# Patient Record
Sex: Male | Born: 1950 | Race: White | Hispanic: No | Marital: Married | Smoking: Never smoker
Health system: Southern US, Community
[De-identification: ages and names within clinical notes are randomized; demographics above are authoritative.]

## PROBLEM LIST (undated history)

## (undated) DIAGNOSIS — C50919 Malignant neoplasm of unspecified site of unspecified female breast: Secondary | ICD-10-CM

## (undated) DIAGNOSIS — C50929 Malignant neoplasm of unspecified site of unspecified male breast: Secondary | ICD-10-CM

## (undated) HISTORY — PX: COLONOSCOPY: SHX174

## (undated) HISTORY — DX: Malignant neoplasm of unspecified site of unspecified female breast: C50.919

## (undated) HISTORY — DX: Malignant neoplasm of unspecified site of unspecified male breast: C50.929

---

## 1989-10-29 HISTORY — PX: OTHER SURGICAL HISTORY: SHX169

## 2009-03-22 ENCOUNTER — Ambulatory Visit (HOSPITAL_BASED_OUTPATIENT_CLINIC_OR_DEPARTMENT_OTHER): Admission: RE | Admit: 2009-03-22 | Discharge: 2009-03-22 | Payer: Self-pay | Admitting: Orthopedic Surgery

## 2011-02-06 LAB — BASIC METABOLIC PANEL
CO2: 31 mEq/L (ref 19–32)
Calcium: 9.5 mg/dL (ref 8.4–10.5)
Chloride: 93 mEq/L — ABNORMAL LOW (ref 96–112)
GFR calc Af Amer: >60 mL/min (ref >60)
Potassium: 3.9 mEq/L (ref 3.5–5.1)
Sodium: 134 mEq/L — ABNORMAL LOW (ref 135–145)

## 2011-03-13 NOTE — Op Note (Signed)
NAMESANDFORD, DIOP                ACCOUNT NO.:  [id number]   MEDICAL RECORD NO.:  [id number]          PATIENT TYPE:  AMB   LOCATION:  DSC                          FACILITY:  [facility]   PHYSICIAN:  Katy Fitch. Sypher, M.D. DATE OF BIRTH:  [birth year]   DATE OF PROCEDURE:  03/22/2009  DATE OF DISCHARGE:                               OPERATIVE REPORT   PREOPERATIVE DIAGNOSES:  Chronic left wrist radial-sided pain with  positive Finkelstein maneuver and failure to respond to steroid  injection.   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   OPERATION:  Exploration of left first dorsal compartment, debridement of  steroid deposit, and release of compartment, relieving compression of  extensor pollicis brevis.   OPERATING SURGEON:  Katy Fitch. Sypher, MD   ASSISTANT:  Marveen Reeks Dasnoit, PA-C   ANESTHESIA:  2% lidocaine, field block, and first dorsal compartment  block supplemented by IV sedation.   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  He presented for evaluation of a painful left distal  radial forearm on February 09, 2009.   At that time, he was noted to have pain with wrist motion.  He was  unable to play racquetball or golf.   Our clinical diagnosis at the time of his initial presentation was a  painful first dorsal compartment stenosing tenosynovitis.  He had  significant swelling proximal to the compartment.   We advised a trial of steroid injection and splinting.   On February 09, 2009, he was injected with Depo-Medrol and lidocaine from a  proximal approach into the first dorsal compartment.  He was placed in  proper forearm-based thumb spica splint.  After nearly 4 weeks of rest,  he  still had persistent pain and a positive Finkelstein maneuver.   Due to his persistent discomfort and inability to play sports, he  requested that we proceed with surgical treatment of his first dorsal  compartment stenosing tenosynovitis.   After informed consent, he is brought to the operating room at this  time.   PROCEDURE:  Gryphon Vanderveen was brought to the operating room and placed in  supine position upon the operating table.  Following a preoperative  discussion with Dr. Gypsy Balsam, lidocaine anesthesia and sedation was  suggested and accepted by Mr. Oros.   He was brought to room 6 of the [facility], placed in supine  position upon the operating table, and under Dr. Burnett Corrente supervision,  sedation provided.   The left arm was prepped with Betadine soap and solution and sterilely  draped.  A pneumatic tourniquet was applied to the proximal brachium.  Lidocaine 2% was infiltrated in the path intended incision into the  first dorsal compartment.  After a few moments, anesthesia was  excellent.  The arm was then exsanguinated with an Esmarch bandage and  arterial tourniquet was inflated to 230 mmHg.  A short transverse  incision was fashioned directly over the palpably thickened compartment.  Subcutaneous tissues were carefully divided taking care to gently clear  inflammatory tissue off the compartment with a Therapist, nutritional followed  by placement of blunt Ragnell retractors.  The compartment was noted to  be bulging.  There was an anatomic variation with the extensor pollicis  brevis muscle belly extending into the dorsal aspect of the compartment.   The compartment was released and tendon inventory accomplished.  There  were 2 large slips of the abductor pollicis longus and a single slip of  the extensor pollicis brevis.  The muscle belly did transit the  compartment and may be the cause of his Finkelstein maneuver pain and  chronic sports impairment.  There was no sign  of a significant  inflammatory disorder.   The proximal forearm was inspected.  No other masses or predicaments  were noted.  The steroid deposit was noted to be in the floor of the  compartment and was debrided with a rongeur.  The wound was then  repaired with intradermal 3-0 Prolene and Steri-Strips.  A compressive  dressing was applied with sterile gauze and Ace wrap.  For aftercare,  Mr. Winkles was encouraged to begin immediate range of motion excises.  I  will see him back in follow up in the office in approximately 1 week for  wound check and suture removal at approximately 7-10 days.      Katy Fitch Sypher, M.D.  Electronically Signed     RVS/MEDQ  D:  03/22/2009  T:  03/22/2009  Job:  [id number]

## 2011-12-26 ENCOUNTER — Encounter: Payer: Self-pay | Admitting: Gastroenterology

## 2011-12-31 ENCOUNTER — Encounter: Payer: Self-pay | Admitting: Gastroenterology

## 2012-01-09 ENCOUNTER — Ambulatory Visit (AMBULATORY_SURGERY_CENTER): Payer: 59 | Admitting: *Deleted

## 2012-01-09 ENCOUNTER — Encounter: Payer: Self-pay | Admitting: Gastroenterology

## 2012-01-09 VITALS — Ht 70.0 in | Wt 168.4 lb

## 2012-01-09 DIAGNOSIS — Z1211 Encounter for screening for malignant neoplasm of colon: Secondary | ICD-10-CM

## 2012-01-09 MED ORDER — PEG-KCL-NACL-NASULF-NA ASC-C 100 G PO SOLR: 1.0000 | Freq: Once | ORAL | Status: DC

## 2012-01-23 ENCOUNTER — Encounter: Payer: Self-pay | Admitting: Gastroenterology

## 2012-01-30 ENCOUNTER — Encounter: Payer: Self-pay | Admitting: Gastroenterology

## 2012-03-05 ENCOUNTER — Encounter: Payer: Self-pay | Admitting: Gastroenterology

## 2012-03-05 ENCOUNTER — Ambulatory Visit (AMBULATORY_SURGERY_CENTER): Payer: 59 | Admitting: Gastroenterology

## 2012-03-05 VITALS — BP 127/78 | HR 68 | Temp 96.4°F | Resp 62 | Ht 70.0 in | Wt 168.0 lb

## 2012-03-05 DIAGNOSIS — Z1211 Encounter for screening for malignant neoplasm of colon: Secondary | ICD-10-CM

## 2012-03-05 MED ORDER — SODIUM CHLORIDE 0.9 % IV SOLN: 500.0000 mL | INTRAVENOUS | Status: DC

## 2012-03-05 NOTE — Op Note (Signed)
[facility] [address] [place], [place]  [address]  COLONOSCOPY PROCEDURE REPORT  PATIENT:  Riley Murray, Riley Murray  MR#:  [id number] BIRTHDATE:  Sep 18, 1951, 60 yrs. old  GENDER:  male ENDOSCOPIST:  Vania Rea. Jarold Motto, MD, [facility] REF. BY: PROCEDURE DATE:  03/05/2012 PROCEDURE:  Average-risk screening colonoscopy G0121 ASA CLASS:  Class I INDICATIONS:  Routine Risk Screening MEDICATIONS:   propofol (Diprivan) 170 mg IV  DESCRIPTION OF PROCEDURE:   After the risks and benefits and of the procedure were explained, informed consent was obtained. Digital rectal exam was performed and revealed no abnormalities. The LB CF-H180AL [id number] endoscope was introduced through the anus and advanced to the cecum, which was identified by both the appendix and ileocecal valve.  The quality of the prep was adequate, using MoviPrep.  The instrument was then slowly withdrawn as the colon was fully examined. <<PROCEDUREIMAGES>>  FINDINGS:  No polyps or cancers were seen.  This was otherwise a normal examination of the colon.   Retroflexed views in the rectum revealed no abnormalities.    The scope was then withdrawn from the patient and the procedure completed.  COMPLICATIONS:  None ENDOSCOPIC IMPRESSION: 1) No polyps or cancers 2) Otherwise normal examination RECOMMENDATIONS: 1) Continue current colorectal screening recommendations for "routine risk" patients with a repeat colonoscopy in 10 years.  REPEAT EXAM:  No  ______________________________ Vania Rea. Jarold Motto, MD, [facility]  CC:  Creola Corn, MD  n. Rosalie Doctor:   Vania Rea. Acasia Skilton at 03/05/2012 10:09 AM  [place], [id number]

## 2012-03-05 NOTE — Patient Instructions (Signed)
YOU HAD AN ENDOSCOPIC PROCEDURE TODAY AT THE [facility]: Refer to the procedure report that was given to you for any specific questions about what was found during the examination.  If the procedure report does not answer your questions, please call your gastroenterologist to clarify.  If you requested that your care partner not be given the details of your procedure findings, then the procedure report has been included in a sealed envelope for you to review at your convenience later.  YOU SHOULD EXPECT: Some feelings of bloating in the abdomen. Passage of more gas than usual.  Walking can help get rid of the air that was put into your GI tract during the procedure and reduce the bloating. If you had a lower endoscopy (such as a colonoscopy or flexible sigmoidoscopy) you may notice spotting of blood in your stool or on the toilet paper. If you underwent a bowel prep for your procedure, then you may not have a normal bowel movement for a few days.  DIET: Your first meal following the procedure should be a light meal and then it is ok to progress to your normal diet.  A half-sandwich or bowl of soup is an example of a good first meal.  Heavy or fried foods are harder to digest and may make you feel nauseous or bloated.  Likewise meals heavy in dairy and vegetables can cause extra gas to form and this can also increase the bloating.  Drink plenty of fluids but you should avoid alcoholic beverages for 24 hours.  ACTIVITY: Your care partner should take you home directly after the procedure.  You should plan to take it easy, moving slowly for the rest of the day.  You can resume normal activity the day after the procedure however you should NOT DRIVE or use heavy machinery for 24 hours (because of the sedation medicines used during the test).    SYMPTOMS TO REPORT IMMEDIATELY: A gastroenterologist can be reached at any hour.  During normal business hours, 8:30 AM to 5:00 PM Monday through Friday,  call [phone].  After hours and on weekends, please call the GI answering service at [phone] who will take a message and have the physician on call contact you.   Following lower endoscopy (colonoscopy or flexible sigmoidoscopy):  Excessive amounts of blood in the stool  Significant tenderness or worsening of abdominal pains  Swelling of the abdomen that is new, acute  Fever of 100F or higher  Following upper endoscopy (EGD)  Vomiting of blood or coffee ground material  New chest pain or pain under the shoulder blades  Painful or persistently difficult swallowing  New shortness of breath  Fever of 100F or higher  Black, tarry-looking stools  FOLLOW UP: If any biopsies were taken you will be contacted by phone or by letter within the next 1-3 weeks.  Call your gastroenterologist if you have not heard about the biopsies in 3 weeks.  Our staff will call the home number listed on your records the next business day following your procedure to check on you and address any questions or concerns that you may have at that time regarding the information given to you following your procedure. This is a courtesy call and so if there is no answer at the home number and we have not heard from you through the emergency physician on call, we will assume that you have returned to your regular daily activities without incident.  SIGNATURES/CONFIDENTIALITY: You and/or your care   partner have signed paperwork which will be entered into your electronic medical record.  These signatures attest to the fact that that the information above on your After Visit Summary has been reviewed and is understood.  Full responsibility of the confidentiality of this discharge information lies with you and/or your care-partner.   REPEAT COLONOSCOPY IN 10 YEARS 

## 2012-03-05 NOTE — Progress Notes (Signed)
Patient did not have preoperative order for IV antibiotic SSI prophylaxis. (G8918)  Patient did not experience any of the following events: a burn prior to discharge; a fall within the facility; wrong site/side/patient/procedure/implant event; or a hospital transfer or hospital admission upon discharge from the facility. (G8907)  

## 2012-03-06 ENCOUNTER — Telehealth: Payer: Self-pay | Admitting: *Deleted

## 2012-03-06 NOTE — Telephone Encounter (Signed)
  Follow up Call-  Call back number 03/05/2012  Post procedure Call Back phone  # [phone] cell  Permission to leave phone message Yes     Patient questions:  Do you have a fever, pain , or abdominal swelling? no Pain Score  0 *  Have you tolerated food without any problems? yes  Have you been able to return to your normal activities? yes  Do you have any questions about your discharge instructions: Diet   no Medications  no Follow up visit  no  Do you have questions or concerns about your Care? no  Actions: * If pain score is 4 or above: No action needed, pain <4.

## 2015-02-21 ENCOUNTER — Other Ambulatory Visit: Payer: Self-pay | Admitting: Internal Medicine

## 2015-02-21 DIAGNOSIS — N63 Unspecified lump in unspecified breast: Secondary | ICD-10-CM

## 2015-02-23 ENCOUNTER — Other Ambulatory Visit: Payer: Self-pay | Admitting: Internal Medicine

## 2015-02-23 ENCOUNTER — Ambulatory Visit
Admission: RE | Admit: 2015-02-23 | Discharge: 2015-02-23 | Disposition: A | Discharge status: Home / Self Care | Payer: BLUE CROSS/BLUE SHIELD | Source: Ambulatory Visit | Attending: Internal Medicine | Admitting: Internal Medicine

## 2015-02-23 DIAGNOSIS — N63 Unspecified lump in unspecified breast: Secondary | ICD-10-CM

## 2015-02-24 ENCOUNTER — Other Ambulatory Visit: Payer: Self-pay | Admitting: Radiology

## 2015-02-24 ENCOUNTER — Other Ambulatory Visit: Payer: Self-pay | Admitting: Internal Medicine

## 2015-02-24 DIAGNOSIS — N63 Unspecified lump in unspecified breast: Secondary | ICD-10-CM

## 2015-02-24 DIAGNOSIS — C50919 Malignant neoplasm of unspecified site of unspecified female breast: Secondary | ICD-10-CM

## 2015-02-24 HISTORY — DX: Malignant neoplasm of unspecified site of unspecified female breast: C50.919

## 2015-03-01 ENCOUNTER — Telehealth: Payer: Self-pay | Admitting: *Deleted

## 2015-03-01 NOTE — Telephone Encounter (Signed)
Received referral from [place].  Received appt from Dr. Jana Hakim.  Called pt and confirmed 03/02/15 genetic appt & 03/04/15 med onc appt w/ pt.  Emailed pt appt info as requested.  Emailed Engineer, civil (consulting) at [employer] to make her aware.  Placed a copy of records in Dr [employer] box and took one to HIM to scan.

## 2015-03-02 ENCOUNTER — Ambulatory Visit (HOSPITAL_BASED_OUTPATIENT_CLINIC_OR_DEPARTMENT_OTHER): Payer: BLUE CROSS/BLUE SHIELD | Admitting: Genetic Counselor

## 2015-03-02 ENCOUNTER — Other Ambulatory Visit: Payer: BLUE CROSS/BLUE SHIELD

## 2015-03-02 ENCOUNTER — Other Ambulatory Visit: Payer: Self-pay | Admitting: Surgery

## 2015-03-02 ENCOUNTER — Encounter (HOSPITAL_BASED_OUTPATIENT_CLINIC_OR_DEPARTMENT_OTHER): Payer: Self-pay | Admitting: *Deleted

## 2015-03-02 ENCOUNTER — Encounter: Payer: Self-pay | Admitting: Genetic Counselor

## 2015-03-02 DIAGNOSIS — C50922 Malignant neoplasm of unspecified site of left male breast: Secondary | ICD-10-CM | POA: Diagnosis not present

## 2015-03-02 DIAGNOSIS — Z800 Family history of malignant neoplasm of digestive organs: Secondary | ICD-10-CM | POA: Diagnosis not present

## 2015-03-02 DIAGNOSIS — C50919 Malignant neoplasm of unspecified site of unspecified female breast: Secondary | ICD-10-CM | POA: Insufficient documentation

## 2015-03-02 DIAGNOSIS — Z315 Encounter for genetic counseling: Secondary | ICD-10-CM

## 2015-03-02 DIAGNOSIS — C50212 Malignant neoplasm of upper-inner quadrant of left female breast: Secondary | ICD-10-CM | POA: Insufficient documentation

## 2015-03-02 DIAGNOSIS — C50912 Malignant neoplasm of unspecified site of left female breast: Secondary | ICD-10-CM

## 2015-03-02 DIAGNOSIS — Z170 Estrogen receptor positive status [ER+]: Secondary | ICD-10-CM | POA: Insufficient documentation

## 2015-03-02 MED ORDER — DEXTROSE 5 % IV SOLN: 2.0000 g | INTRAVENOUS | Status: AC

## 2015-03-02 MED ORDER — CHLORHEXIDINE GLUCONATE 4 % EX LIQD: 1.0000 "application " | Freq: Once | CUTANEOUS | Status: DC

## 2015-03-02 NOTE — Progress Notes (Signed)
No labs per anesthesia needed-waiting on [facility] orders-going to cancer center 03/04/15-probably getting labs then

## 2015-03-02 NOTE — Progress Notes (Signed)
REFERRING PROVIDER: Shon Baton, MD [place], [place] [address]   Dr. Kathaleen Maser Magrinat, MD  PRIMARY PROVIDER:  Precious Reel, MD  PRIMARY REASON FOR VISIT:  1. Male breast cancer, left      HISTORY OF PRESENT ILLNESS:   Riley Murray, a 64 y.o. male, was seen for a [place] cancer genetics consultation at the request of Dr. Lucia Gaskins due to a personal history of cancer.  Riley Murray presents to clinic today to discuss the possibility of a hereditary predisposition to cancer, genetic testing, and to further clarify his future cancer risks, as well as potential cancer risks for family members.   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  The tumor has not had completed prognostic factors performed. This will be treated with surgery and possibly tamoxifen and radiation, but he does not think he will need chemotherapy.    CANCER HISTORY:   No history exists.     HORMONAL RISK FACTORS:  Colonoscopy: yes; normal. Mammogram within the last year: yes. Number of breast biopsies: 1. Up to date with prostate exams:  yes. Any excessive radiation exposure in the past:  no  Past Medical History  Diagnosis Date  . Breast cancer   . Male breast cancer     Past Surgical History  Procedure Laterality Date  . Lumbar disc removal  1991    History   Social History  . Marital Status: Married    Spouse Name: [name]  . Number of Children: 2  . Years of Education: N/A   Social History Main Topics  . Smoking status: Never Smoker   . Smokeless tobacco: Never Used  . Alcohol Use: No  . Drug Use: No  . Sexual Activity: Not on file   Other Topics Concern  . None   Social History Narrative     FAMILY HISTORY:  We obtained a detailed, 4-generation family history.  Significant diagnoses are listed below: Family History  Problem Relation Age of Onset  . Esophageal cancer Neg Hx   . Rectal cancer Neg Hx   .  Stomach cancer Neg Hx   . Stroke Mother   . Diabetes Mother   . Lung cancer Father 7   The patient has two daughters, a brother and sister who are all healthy and cancer free.  His parents are deceased.  His father was a smoker and died of lung cancer at 32.  His mother died of complications of a stroke.  His fater was an only child who's parents died of heart attacks.  Riley Murray' mother had three sisters, none of whom had cancer, and his parents were cancer free. Patient's maternal ancestors are of [place] and [place] European descent, and paternal ancestors are of English descent. There is no reported Ashkenazi Jewish ancestry. There is no known consanguinity.  GENETIC COUNSELING ASSESSMENT: Riley Murray is a 64 y.o. male with a personal history of breast cancer which somewhat suggestive of a hereditary cancer syndrome and predisposition to cancer. We, therefore, discussed and recommended the following at today's visit.   DISCUSSION: We reviewed the characteristics, features and inheritance patterns of hereditary cancer syndromes. We discussed that aobut 2/3 of male breast cancer are the result of hereditary cancer syndromes, most commonly due to BRCA2 mutations.  However, other genes, including BRCA1, PALB2, CHEK2 and ATM have also been implicated in male breast cancer.  We also discussed genetic testing, including  the appropriate family members to test, the process of testing, insurance coverage and turn-around-time for results. We discussed the implications of a negative, positive and/or variant of uncertain significant result. If he should test positive for a hereditary cancer syndrome then his siblings and daughters would be at 50% risk for also having the same gene mutation.  At that time we could offer genetic testing to these individuals to determine if they would be at risk for cancer based on a hereditary cancer syndrome.  We recommended Riley Murray pursue genetic testing for the Breast  High/Moderate risk gene panel. The Breast High/Moderate Risk gene panel offered by [facility] includes sequencing and deletion/duplication analysis of the following 9 genes: ATM, BRCA1, BRCA2, CDH1, CHEK2, PALB2, PTEN, STK11, and TP53.   PLAN: After considering the risks, benefits, and limitations, Riley Murray  provided informed consent to pursue genetic testing and the blood sample was sent to [facility] for analysis of the Breast High/Moderate Risk gene panel. Surgery is going to be scheduled for about 2 weeks from now, as the family is moving to [place].  Therefore, I have asked that the results be placed on STAT.  Results should be available within approximately 2 weeks' time, at which point they will be disclosed by telephone to Riley Murray, as will any additional recommendations warranted by these results. Riley Murray will receive a summary of his genetic counseling visit and a copy of his results once available. This information will also be available in Epic. We encouraged Riley Murray to remain in contact with cancer genetics annually so that we can continuously update the family history and inform him of any changes in cancer genetics and testing that may be of benefit for his family. Riley Murray questions were answered to his satisfaction today. Our contact information was provided should additional questions or concerns arise.  Lastly, we encouraged Riley Murray to remain in contact with cancer genetics annually so that we can continuously update the family history and inform him of any changes in cancer genetics and testing that may be of benefit for this family.   [name] questions were answered to his satisfaction today. Our contact information was provided should additional questions or concerns arise. Thank you for the referral and allowing [place] to share in the care of your patient.   Karen P. Florene Glen, [place], [facility] Certified Genetic Counselor [email or web address] phone: [phone]  The  patient was seen for a total of 60 minutes in face-to-face genetic counseling.  This patient was discussed with Drs. Magrinat, Lindi Adie and/or Burr Medico who agrees with the above.    _______________________________________________________________________ For Office Staff:  Number of people involved in session: 2 Was an Intern/ student involved with case: no

## 2015-03-03 ENCOUNTER — Other Ambulatory Visit: Payer: Self-pay | Admitting: *Deleted

## 2015-03-03 ENCOUNTER — Other Ambulatory Visit: Payer: Self-pay | Admitting: Oncology

## 2015-03-03 ENCOUNTER — Inpatient Hospital Stay: Admission: RE | Admit: 2015-03-03 | Payer: BLUE CROSS/BLUE SHIELD | Source: Ambulatory Visit

## 2015-03-03 DIAGNOSIS — C50919 Malignant neoplasm of unspecified site of unspecified female breast: Secondary | ICD-10-CM

## 2015-03-04 ENCOUNTER — Encounter: Payer: Self-pay | Admitting: Oncology

## 2015-03-04 ENCOUNTER — Other Ambulatory Visit (HOSPITAL_BASED_OUTPATIENT_CLINIC_OR_DEPARTMENT_OTHER): Payer: BLUE CROSS/BLUE SHIELD

## 2015-03-04 ENCOUNTER — Telehealth: Payer: Self-pay | Admitting: Oncology

## 2015-03-04 ENCOUNTER — Ambulatory Visit (HOSPITAL_BASED_OUTPATIENT_CLINIC_OR_DEPARTMENT_OTHER): Payer: BLUE CROSS/BLUE SHIELD | Admitting: Oncology

## 2015-03-04 ENCOUNTER — Ambulatory Visit: Payer: BLUE CROSS/BLUE SHIELD

## 2015-03-04 VITALS — BP 121/71 | HR 73 | Temp 97.8°F | Resp 18 | Wt 169.6 lb

## 2015-03-04 DIAGNOSIS — C50919 Malignant neoplasm of unspecified site of unspecified female breast: Secondary | ICD-10-CM

## 2015-03-04 DIAGNOSIS — C50222 Malignant neoplasm of upper-inner quadrant of left male breast: Secondary | ICD-10-CM

## 2015-03-04 DIAGNOSIS — Z170 Estrogen receptor positive status [ER+]: Secondary | ICD-10-CM

## 2015-03-04 DIAGNOSIS — E871 Hypo-osmolality and hyponatremia: Secondary | ICD-10-CM

## 2015-03-04 LAB — CBC WITH DIFFERENTIAL/PLATELET
BASO%: 0.7 % (ref 0.0–2.0)
BASOS ABS: 0 10*3/uL (ref 0.0–0.1)
EOS%: 1.1 % (ref 0.0–7.0)
Eosinophils Absolute: 0.1 10*3/uL (ref 0.0–0.5)
HEMATOCRIT: 41 % (ref 38.4–49.9)
HEMOGLOBIN: 13.7 g/dL (ref 13.0–17.1)
LYMPH%: 19.8 % (ref 14.0–49.0)
MCH: 32 pg (ref 27.2–33.4)
MCHC: 33.4 g/dL (ref 32.0–36.0)
MCV: 95.9 fL (ref 79.3–98.0)
MONO#: 0.5 10*3/uL (ref 0.1–0.9)
MONO%: 8.3 % (ref 0.0–14.0)
NEUT#: 4.1 10*3/uL (ref 1.5–6.5)
NEUT%: 70.1 % (ref 39.0–75.0)
Platelets: 218 10*3/uL (ref 140–400)
RBC: 4.28 10*6/uL (ref 4.20–5.82)
RDW: 12.7 % (ref 11.0–14.6)
WBC: 5.9 10*3/uL (ref 4.0–10.3)
lymph#: 1.2 10*3/uL (ref 0.9–3.3)

## 2015-03-04 LAB — COMPREHENSIVE METABOLIC PANEL (CC13)
ALBUMIN: 4 g/dL (ref 3.5–5.0)
ALT: 18 U/L (ref 0–55)
ANION GAP: 13 meq/L — AB (ref 3–11)
AST: 21 U/L (ref 5–34)
Alkaline Phosphatase: 60 U/L (ref 40–150)
BUN: 11.7 mg/dL (ref 7.0–26.0)
CALCIUM: 9.1 mg/dL (ref 8.4–10.4)
CO2: 22 meq/L (ref 22–29)
CREATININE: 0.9 mg/dL (ref 0.7–1.3)
Chloride: 97 mEq/L — ABNORMAL LOW (ref 98–109)
EGFR: >90 mL/min/{1.73_m2} (ref >90)
Glucose: 69 mg/dl — ABNORMAL LOW (ref 70–140)
POTASSIUM: 4.5 meq/L (ref 3.5–5.1)
SODIUM: 132 meq/L — AB (ref 136–145)
TOTAL PROTEIN: 7 g/dL (ref 6.4–8.3)
Total Bilirubin: 0.7 mg/dL (ref 0.20–1.20)

## 2015-03-04 NOTE — Progress Notes (Signed)
Checked in new pt with no financial concerns. °

## 2015-03-04 NOTE — Telephone Encounter (Signed)
Patient was aware already of follow up appointment and left,appointment made

## 2015-03-05 DIAGNOSIS — E871 Hypo-osmolality and hyponatremia: Secondary | ICD-10-CM | POA: Insufficient documentation

## 2015-03-05 NOTE — Progress Notes (Signed)
[place]  Telephone:([phone]) [phone] Fax:(336) [phone]     ID: Ahmir Bracken DOB: [birth year]  MR#: [id number]  ONG#:[id number]  Patient Care Team: Shon Baton, MD as PCP - General (Internal Medicine) PCP: Precious Reel, MD SU: Alphonsa Overall MD OTHER MD:  CHIEF COMPLAINT: Left-sided breast cancer  CURRENT TREATMENT: Awaiting definitive therapy   BREAST CANCER HISTORY: Richardson Landry was taking a shower mid April 2016 when he noted a lump in his left breast. He brought it to his wife's attention (she is a former patient of mine with her own history of breast cancer), and then to his physician who set him up for bilateral diagnostic mammography with tomosynthesis and left breast ultrason[facility] April 20 05/18/2015. Mammography showed a breast density category A. In the upper inner quadrant of the left breast there was an irregular mass measuring 1.1 cm. This was palpable and mobile. By ultrasound there was an irregularly marginated mass at this location in the left breast measuring 1.1 cm. The left axilla appeared normal sonographically.  Biopsy of the left breast mass in question 02/24/2015 showed (SAA 16-07/02/2007) an invasive ductal carcinoma, grade 1, with a prognostic panel still pending.  The patient's subsequent history is as detailed below.  INTERVAL HISTORY: Richardson Landry was evaluated in the breast clinic 03/04/2015 accompanied by his wife [name].  REVIEW OF SYSTEMS: Aside from the mass itself, there were no specific symptoms leading to the original mammogram, which was routinely scheduled. The patient denies unusual headaches, visual changes, nausea, vomiting, stiff neck, dizziness, or gait imbalance. There has been no cough, phlegm production, or pleurisy, no chest pain or pressure, and no change in bowel or bladder habits. The patient denies fever, rash, bleeding, unexplained fatigue or unexplained weight loss. He exercises 4 days a week at the gym. A detailed  review of systems was otherwise entirely negative.  PAST MEDICAL HISTORY: Past Medical History  Diagnosis Date  . Breast cancer   . Male breast cancer     PAST SURGICAL HISTORY: Past Surgical History  Procedure Laterality Date  . Lumbar disc removal  1991  . Colonoscopy      FAMILY HISTORY Family History  Problem Relation Age of Onset  . Esophageal cancer Neg Hx   . Rectal cancer Neg Hx   . Stomach cancer Neg Hx   . Stroke Mother   . Diabetes Mother   . Lung cancer Father 31   the patient's father died from lung cancer the age of 64. He had been a smoker. The patient's mother died at the age of 64 following a stroke in the setting of type 1 diabetes. The patient had one brother, one sister. There is no history of breast or ovarian cancer in the family.  SOCIAL HISTORY: Richardson Landry works in [place]. His wife [name] works in Psychologist, counselling for the [employer]. They are planning to move to [place] in the near future. Daughter Claris Pong lives in [place], daughter Fransisco Beau in [place]. There are no grandchildren so far   ADVANCED DIRECTIVES: In place   HEALTH MAINTENANCE: History  Substance Use Topics  . Smoking status: Never Smoker   . Smokeless tobacco: Never Used  . Alcohol Use: No     Colonoscopy: 2015/David Patterson  PSA:  Bone density:  Lipid panel:  No Known Allergies  Current Outpatient Prescriptions  Medication Sig Dispense Refill  . ALPRAZolam (XANAX) 0.25 MG tablet Take 0.25 mg by mouth at bedtime as needed.    Marland Kitchen  cholecalciferol (VITAMIN D) 1000 UNITS tablet Take 1,000 Units by mouth daily.    . citalopram (CELEXA) 10 MG tablet Take 10 mg by mouth daily.    . fish oil-omega-3 fatty acids 1000 MG capsule Take 1 g by mouth daily.    . Flaxseed, Linseed, OIL Take 1,200 mg by mouth.    . Garlic Oil 0017 MG CAPS Take 1 capsule by mouth.    . MULTIPLE VITAMIN PO Take 1 tablet by mouth.    . Red Yeast Rice 600 MG CAPS Take  1 capsule by mouth.    . vitamin C (ASCORBIC ACID) 500 MG tablet Take 500 mg by mouth daily.     No current facility-administered medications for this visit.   Facility-Administered Medications Ordered in Other Visits  Medication Dose Route Frequency Provider Last Rate Last Dose  . chlorhexidine (HIBICLENS) 4 % liquid 1 application  1 application Topical Once Alphonsa Overall, MD      . chlorhexidine (HIBICLENS) 4 % liquid 1 application  1 application Topical Once Alphonsa Overall, MD        OBJECTIVE: Middle-aged white male who looks well Filed Vitals:   03/04/15 1222  BP: 121/71  Pulse: 73  Temp: 97.8 F (36.6 C)  Resp: 18     Body mass index is 24.34 kg/(m^2).    ECOG FS:0 - Asymptomatic  Ocular: Sclerae unicteric, pupils equal, round and reactive to light Ear-nose-throat: Oropharynx clear, good dentition Lymphatic: No cervical or supraclavicular adenopathy Lungs no rales or rhonchi, good excursion bilaterally Heart regular rate and rhythm, no murmur appreciated Abd soft, nontender, positive bowel sounds MSK no focal spinal tenderness, no joint edema Neuro: non-focal, well-oriented, appropriate affect Breasts: No significant gynecomastia. The right breast is unremarkable. In the left breast there is a barely palpable mass in the upper inner quadrant measuring approximately 1 cm by palpation. There is no erythema or tenderness. The left axilla is benign.   LAB RESULTS:  CMP     Component Value Date/Time   NA 132* 03/04/2015 1210   NA 134* 03/17/2009 1530   K 4.5 03/04/2015 1210   K 3.9 03/17/2009 1530   CL 93* 03/17/2009 1530   CO2 22 03/04/2015 1210   CO2 31 03/17/2009 1530   GLUCOSE 69* 03/04/2015 1210   GLUCOSE 116* 03/17/2009 1530   BUN 11.7 03/04/2015 1210   BUN 9 03/17/2009 1530   CREATININE 0.9 03/04/2015 1210   CREATININE 0.98 03/17/2009 1530   CALCIUM 9.1 03/04/2015 1210   CALCIUM 9.5 03/17/2009 1530   PROT 7.0 03/04/2015 1210   ALBUMIN 4.0 03/04/2015 1210    AST 21 03/04/2015 1210   ALT 18 03/04/2015 1210   ALKPHOS 60 03/04/2015 1210   BILITOT 0.70 03/04/2015 1210   GFRNONAA >60 03/17/2009 1530   GFRAA  03/17/2009 1530    >60        The eGFR has been calculated using the MDRD equation. This calculation has not been validated in all clinical situations. eGFR's persistently <60 mL/min signify possible Chronic Kidney Disease.    INo results found for: SPEP, UPEP  Lab Results  Component Value Date   WBC 5.9 03/04/2015   NEUTROABS 4.1 03/04/2015   HGB 13.7 03/04/2015   HCT 41.0 03/04/2015   MCV 95.9 03/04/2015   PLT 218 03/04/2015      Chemistry      Component Value Date/Time   NA 132* 03/04/2015 1210   NA 134* 03/17/2009 1530   K 4.5 03/04/2015  1210   K 3.9 03/17/2009 1530   CL 93* 03/17/2009 1530   CO2 22 03/04/2015 1210   CO2 31 03/17/2009 1530   BUN 11.7 03/04/2015 1210   BUN 9 03/17/2009 1530   CREATININE 0.9 03/04/2015 1210   CREATININE 0.98 03/17/2009 1530      Component Value Date/Time   CALCIUM 9.1 03/04/2015 1210   CALCIUM 9.5 03/17/2009 1530   ALKPHOS 60 03/04/2015 1210   AST 21 03/04/2015 1210   ALT 18 03/04/2015 1210   BILITOT 0.70 03/04/2015 1210       No results found for: LABCA2  No components found for: LABCA125  No results for input(s): INR in the last 168 hours.  Urinalysis No results found for: COLORURINE, APPEARANCEUR, LABSPEC, PHURINE, GLUCOSEU, HGBUR, BILIRUBINUR, KETONESUR, PROTEINUR, UROBILINOGEN, NITRITE, LEUKOCYTESUR  STUDIES: US Breast Ltd Uni Left Inc Axilla  02/23/2015   CLINICAL DATA:  Patient recently noted a small palpable mass within the upper inner quadrant of the left breast in a periareolar location. There is no family history of breast cancer.  EXAM: DIGITAL DIAGNOSTIC BILATERAL MAMMOGRAM WITH 3D TOMOSYNTHESIS WITH CAD  ULTRASOUND LEFT BREAST  COMPARISON:  None.  ACR Breast Density Category a: The breast tissue is almost entirely fatty.  FINDINGS: There is an irregular  mass with pleomorphic calcifications located within the upper inner quadrant of the left breast in the periareolar location. By mammography the mass measures 1.1 cm in size. There are no additional findings within either breast.  Mammographic images were processed with CAD.  On physical exam, there is a firm, mobile, palpable mass located within the left breast at the 11 o'clock position 2 cm from the nipple which by physical examination measures approximately 1 cm in size.  Targeted ultrasound is performed, showing an irregularly marginated heterogeneous mass located within the left breast at the 11 o'clock position 2 cm from the nipple corresponding to the palpable finding. This is associated with increased vascularity and small areas of calcification. The mass measures 1.1 x 1.1 x 0.8 cm in size. This is a suspicious mass and tissue sampling is recommended. I have discussed ultrasound-guided core biopsy of the mass with the patient. This will be scheduled per patient preference.  Ultrasound of the left axilla demonstrates normal axillary contents and no evidence for adenopathy.  IMPRESSION: 1.1 cm suspicious mass located within the left breast at the 11 o'clock position 2 cm from the nipple. Tissue sampling is recommended and ultrasound-guided core biopsy will be scheduled.  RECOMMENDATION: Left breast ultrasound-guided core biopsy.  I have discussed the findings and recommendations with the patient. Results were also provided in writing at the conclusion of the visit. If applicable, a reminder letter will be sent to the patient regarding the next appointment.  BI-RADS CATEGORY  Category 4C: High suspicion for malignancy.   Electronically Signed   By: Altamese Cabal M.D.   On: 02/23/2015 15:49   Mm Diag Breast Tomo Bilateral  02/23/2015   CLINICAL DATA:  Patient recently noted a small palpable mass within the upper inner quadrant of the left breast in a periareolar location. There is no family history of  breast cancer.  EXAM: DIGITAL DIAGNOSTIC BILATERAL MAMMOGRAM WITH 3D TOMOSYNTHESIS WITH CAD  ULTRASOUND LEFT BREAST  COMPARISON:  None.  ACR Breast Density Category a: The breast tissue is almost entirely fatty.  FINDINGS: There is an irregular mass with pleomorphic calcifications located within the upper inner quadrant of the left breast in the periareolar location. By  mammography the mass measures 1.1 cm in size. There are no additional findings within either breast.  Mammographic images were processed with CAD.  On physical exam, there is a firm, mobile, palpable mass located within the left breast at the 11 o'clock position 2 cm from the nipple which by physical examination measures approximately 1 cm in size.  Targeted ultrasound is performed, showing an irregularly marginated heterogeneous mass located within the left breast at the 11 o'clock position 2 cm from the nipple corresponding to the palpable finding. This is associated with increased vascularity and small areas of calcification. The mass measures 1.1 x 1.1 x 0.8 cm in size. This is a suspicious mass and tissue sampling is recommended. I have discussed ultrasound-guided core biopsy of the mass with the patient. This will be scheduled per patient preference.  Ultrasound of the left axilla demonstrates normal axillary contents and no evidence for adenopathy.  IMPRESSION: 1.1 cm suspicious mass located within the left breast at the 11 o'clock position 2 cm from the nipple. Tissue sampling is recommended and ultrasound-guided core biopsy will be scheduled.  RECOMMENDATION: Left breast ultrasound-guided core biopsy.  I have discussed the findings and recommendations with the patient. Results were also provided in writing at the conclusion of the visit. If applicable, a reminder letter will be sent to the patient regarding the next appointment.  BI-RADS CATEGORY  Category 4C: High suspicion for malignancy.   Electronically Signed   By: Altamese Cabal  M.D.   On: 02/23/2015 15:49    ASSESSMENT: 64 y.o. [place] man status post left breast biopsy 02/23/2015 for a clinical  T1c N0, stage IA invasive ductal carcinoma, grade 1, prognostic panel pending   (1) left mastectomy and sentinel lymph node sampling pending  (2) genetics testing sent 03/02/2015, results pending  (3) Oncotype DX to be sent from definitive surgical specimen  (4) need for adjuvant radiation therapy not anticipated  (5) tamoxifen to follow completion of local therapy  PLAN: We spent the better part of today's hour-long appointment discussing the biology of breast cancer in general, and the specifics of the patient's tumor in particular. Richardson Landry understands that breast cancer in men races is strong possibility of carrying a genetic mutation. This is less likely in low-grade estrogen receptor positive tumors especially when there is no family history. Nevertheless he has been appropriately tested, and we are waiting on results.   He understands from his discussion with Dr. Lucia Gaskins that mastectomy with sentinel lymph node sampling is the standard of care for male breast cancer. One major concern of his is whether he should proceed to bilateral mastectomies from the get go if it turns out he is BRCA positive. We discussed that at length and he understands that there is no survival advantage although that is commonly done in that setting. At the end of today's visit he was leaning towards proceeding with left mastectomy alone next week as already scheduled, but had not quite foreclosed the possibility of postponing therapy until the genetics results are known. Of course there is no problem with that option as well.  Though I do not anticipate his needing radiation, nevertheless to complete his team he will meet with Dr. Valere Dross later this month, after the surgical results are known, to make a definitive radiation therapy decision.  We discussed the fact that systemic therapy can  include chemotherapy, but that in small low grade ER positive tumors the benefit may be very marginal and for that reason we request  an Oncotype. I expected to be "low risk", but of course we will have to wait for the results of the tests before making a definitive decision regarding chemotherapy  He is aware of the fact aromatase inhibitors are not documented to work well in men and therefore his antiestrogen will consist of tamoxifen. We briefly discussed the possible toxicities, side effects and complications of that agent and used in men.    Steve's next step is surgery. He has a good understanding of the overall plan.  HE agrees with it.  he knows the goal of treatment in her case is che call with any problems that may develop before her next visit here, which will be late this month.Marland Kitchen  Chauncey Cruel, MD   03/05/2015 1:42 PM Medical Oncology and Hematology [facility] [address] [place], [place] [address] Tel. [phone]    Fax. [phone]

## 2015-03-06 ENCOUNTER — Other Ambulatory Visit: Payer: Self-pay | Admitting: Surgery

## 2015-03-06 DIAGNOSIS — C50912 Malignant neoplasm of unspecified site of left female breast: Secondary | ICD-10-CM

## 2015-03-07 NOTE — H&P (Signed)
Riley Murray 02/28/2015 3:51 PM Location: [facility] Patient #: [id number] DOB: 04-18-1951 Married / Language: Riley Murray / Race: White Male  History of Present Illness: Patient words: breast cancer.  The patient is a 64 year old male who presents with breast cancer. His PCP is [name]. He comes with his wife, Riley Murray.  The patient noticed a lump in his left breast about one week ago. He was in the shower when this happened. His wife has had breast cancer treated by Dr. Alden Benjamin. Because of this familiarity with cancer the patient had studies at the breast center On 23 February 2015. The mammogram showed a 1.1 cm suspicious mass located at the 11 o'clock position of the left breast. The ultrasound the same day confirmed similar findings. Apparently it would take a few days to do the biopsy, so the patient's wife knew Dr. Isaiah Murray. They arranged a biopsy on 24 February 2015 by Dr. [name].   Biopsy of left breast - 02/24/2015 - [id number] - Invasive ductal ca. Breast prognostic studies are pending.  The patient has no family history of breast cancer, has been on no hormonal treatments, has no history of testicular mass.  I discussed the options for breast cancer treatment with the patient. I discussed a multidisciplinary approach to the treatment of breast cancer, which includes medical oncology and radiation oncology. I discussed the surgical options of lumpectomy vs. mastectomy. Sijnce he is a male, there really is no role for lumpectomy. I discussed the options of lymph node biopsy. The treatment plan depends on the pathologic staging of the tumor and the patient's personal wishes. The risks of surgery include, but are not limited to, bleeding, infection, the need for further surgery, and nerve injury.  Past medical history: 1. Back disease that required disc surgery by Dr. [name] in the 1990s. 2. Had colonoscopy about her GI in 2014. Unsure the  physician's name.  Social history: Married. Wife's name [name]. 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 They are moving to [place] first of June. They have 2 daughters ages 39 and 41. His wife has had breast cancer treated by Dr. [name]. It sounds like she had ductal carcinoma in situ which eventually required a mastectomy. She saw Dr. Gunnar Bulla Murray and Dr. Teryl Murray for oncology.  Addendum Note([name] [name] MD; 03/07/2015 6:05 PM) Discussion Sunday night, 5/8, by phone. I talked to the patient about going ahead with surgery vs waiting. His genetic testing is pending. But for now, he wants to go ahead with the surgery on Tuesday, 5/10. DN 03/07/2015  Past Surgical History (Riley Eversole, LPN; 01/27/9378 0:24 PM) Vasectomy  Diagnostic Studies History (Riley Eversole, LPN; 0/06/7352 2:99 PM) Colonoscopy within last year  Allergies (Riley Eversole, LPN; 12/02/2681 4:19 PM) No Known Drug Allergies05/11/2014  Medication History (Riley Eversole, LPN; 03/30/2296 9:89 PM) Xanax (0.25MG  Tablet, Oral) Active. Vitamin D (1000UNIT Capsule, Oral) Active. CeleXA (10MG  Tablet, Oral) Active. Fish Oil (1000MG  Capsule DR, Oral) Active. Flaxseed (Linseed) (1000MG  Capsule, Oral) Active. Garlic Oil (1000MG  Capsule, Oral) Active. HydroDiuril (25MG  Tablet, Oral) Active. Multiple Vitamin (Oral) Active. MoviPrep (100GM For Solution, Oral) Active. Red Yeast Rice (600MG  Capsule, Oral) Active. Ascorbic Acid ER (500MG  Capsule ER, Oral) Active.  Social History (Riley Eversole, LPN; 11/30/1939 7:40 PM) Caffeine use Coffee. No alcohol use No drug use Tobacco use Never smoker.  Family History (Riley Eversole, LPN; 05/29/4480 8:56 PM) Diabetes Mellitus Mother. Respiratory Condition Father.  Review of Systems (Riley Eversole LPN; 07/06/9210 9:41 PM) General Not Present- Appetite Loss, Chills, Fatigue, Fever, Night Sweats, Weight  Gain and Weight Loss. Skin Not Present- Change in Wart/Mole, Dryness, Hives, Jaundice, New Lesions, Non-Healing Wounds, Rash and Ulcer. HEENT Not Present- Earache, Hearing Loss, Hoarseness, Nose Bleed, Oral Ulcers, Ringing in the Ears, Seasonal Allergies, Sinus Pain, Sore Throat, Visual Disturbances, Wears glasses/contact lenses and Yellow Eyes. Respiratory Not Present- Bloody sputum, Chronic Cough, Difficulty Breathing, Snoring and Wheezing. Breast Present- Breast Mass. Not Present- Breast Pain, Nipple Discharge and Skin Changes. Cardiovascular Not Present- Chest Pain, Difficulty Breathing Lying Down, Leg Cramps, Palpitations, Rapid Heart Rate, Shortness of Breath and Swelling of Extremities. Gastrointestinal Not Present- Abdominal Pain, Bloating, Bloody Stool, Change in Bowel Habits, Chronic diarrhea, Constipation, Difficulty Swallowing, Excessive gas, Gets full quickly at meals, Hemorrhoids, Indigestion, Nausea, Rectal Pain and Vomiting. Male Genitourinary Not Present- Blood in Urine, Change in Urinary Stream, Frequency, Impotence, Nocturia, Painful Urination, Urgency and Urine Leakage. Musculoskeletal Not Present- Back Pain, Joint Pain, Joint Stiffness, Muscle Pain, Muscle Weakness and Swelling of Extremities. Neurological Not Present- Decreased Memory, Fainting, Headaches, Numbness, Seizures, Tingling, Tremor, Trouble walking and Weakness. Psychiatric Not Present- Anxiety, Bipolar, Change in Sleep Pattern, Depression, Fearful and Frequent crying. Endocrine Not Present- Cold Intolerance, Excessive Hunger, Hair Changes, Heat Intolerance, Hot flashes and New Diabetes. Hematology Not Present- Easy Bruising, Excessive bleeding, Gland problems, HIV and Persistent Infections.  Vitals (Riley Eversole LPN; 05/01/813 4:81 PM) 02/28/2015 3:52 PM Weight: 170.4 lb Height: 70in Body Surface Area: 1.95 m Body Mass Index: 24.45 kg/m Temp.: 98.33F(Oral)  Pulse: 80 (Regular)  BP: 136/80 (Sitting,  Left Arm, Standard)  Physical Exam: General: WN WM alert and generally healthy appearing. HEENT: Normal. Pupils equal.  Neck: Supple. No mass. No thyroid mass. Lymph Nodes: No supraclavicular, cervical, or axillary nodes.  Breasts: Right - no mass  Left - 1.0 cm nodule at the 11 o'clock position. Breast bruised.  Lungs: Clear to auscultation and symmetric breath sounds. Heart: RRR. No murmur or rub.  Abdomen: Soft. No mass. No tenderness. No hernia. Normal bowel sounds. No abdominal scars. No testicular mass or nodule. Rectal: Not done.  Extremities: Good strength and ROM in upper and lower extremities.  Neurologic: Grossly intact to motor and sensory function. Psychiatric: Has normal mood and affect. Behavior is normal.  Assessment & Plan  BREAST CANCER, LEFT (174.9  C50.912)  Story: Biopsy of left breast - 02/24/2015 - [id number] - Invasive ductal ca.  Prognostic profile pending.  Impression: For med and rad oncology consult. (Wants to sees Dr. Jana Hakim and Dr. Sondra Come)  Genetics consult.   Plan: left mastectomy with left axillary sentinel lymph node biopsy.  Current Plans   Schedule for Surgery  Alphonsa Overall, MD, [facility] Pager: [phone] Office phone:  [phone]

## 2015-03-08 ENCOUNTER — Encounter (HOSPITAL_BASED_OUTPATIENT_CLINIC_OR_DEPARTMENT_OTHER)
Admission: RE | Disposition: A | Discharge status: Home / Self Care | Payer: Self-pay | Source: Ambulatory Visit | Attending: Surgery

## 2015-03-08 ENCOUNTER — Encounter (HOSPITAL_COMMUNITY)
Admission: RE | Admit: 2015-03-08 | Discharge: 2015-03-08 | Disposition: A | Discharge status: Home / Self Care | Payer: BLUE CROSS/BLUE SHIELD | Source: Ambulatory Visit | Attending: Surgery | Admitting: Surgery

## 2015-03-08 ENCOUNTER — Ambulatory Visit (HOSPITAL_BASED_OUTPATIENT_CLINIC_OR_DEPARTMENT_OTHER): Payer: BLUE CROSS/BLUE SHIELD | Admitting: Certified Registered"

## 2015-03-08 ENCOUNTER — Ambulatory Visit (HOSPITAL_BASED_OUTPATIENT_CLINIC_OR_DEPARTMENT_OTHER)
Admission: RE | Admit: 2015-03-08 | Discharge: 2015-03-09 | Disposition: A | Discharge status: Home / Self Care | Payer: BLUE CROSS/BLUE SHIELD | Source: Ambulatory Visit | Attending: Surgery | Admitting: Surgery

## 2015-03-08 ENCOUNTER — Encounter (HOSPITAL_BASED_OUTPATIENT_CLINIC_OR_DEPARTMENT_OTHER): Payer: Self-pay | Admitting: Certified Registered"

## 2015-03-08 DIAGNOSIS — C50919 Malignant neoplasm of unspecified site of unspecified female breast: Secondary | ICD-10-CM | POA: Diagnosis present

## 2015-03-08 DIAGNOSIS — C50929 Malignant neoplasm of unspecified site of unspecified male breast: Secondary | ICD-10-CM

## 2015-03-08 DIAGNOSIS — Z803 Family history of malignant neoplasm of breast: Secondary | ICD-10-CM | POA: Diagnosis not present

## 2015-03-08 DIAGNOSIS — C50222 Malignant neoplasm of upper-inner quadrant of left male breast: Secondary | ICD-10-CM | POA: Diagnosis not present

## 2015-03-08 DIAGNOSIS — Z170 Estrogen receptor positive status [ER+]: Secondary | ICD-10-CM | POA: Insufficient documentation

## 2015-03-08 DIAGNOSIS — R921 Mammographic calcification found on diagnostic imaging of breast: Secondary | ICD-10-CM | POA: Diagnosis not present

## 2015-03-08 DIAGNOSIS — C50912 Malignant neoplasm of unspecified site of left female breast: Secondary | ICD-10-CM

## 2015-03-08 DIAGNOSIS — C50922 Malignant neoplasm of unspecified site of left male breast: Secondary | ICD-10-CM | POA: Diagnosis present

## 2015-03-08 DIAGNOSIS — Z79899 Other long term (current) drug therapy: Secondary | ICD-10-CM | POA: Diagnosis not present

## 2015-03-08 HISTORY — PX: MASTECTOMY W/ SENTINEL NODE BIOPSY: SHX2001

## 2015-03-08 HISTORY — DX: Malignant neoplasm of unspecified site of unspecified male breast: C50.929

## 2015-03-08 LAB — POCT HEMOGLOBIN-HEMACUE: Hemoglobin: 13.9 g/dL (ref 13.0–17.0)

## 2015-03-08 SURGERY — MASTECTOMY WITH SENTINEL LYMPH NODE BIOPSY
Anesthesia: General | Site: Breast | Laterality: Left

## 2015-03-08 MED ORDER — 0.9 % SODIUM CHLORIDE (POUR BTL) OPTIME
TOPICAL | Status: DC | PRN
  Administered 2015-03-08: 250 mL

## 2015-03-08 MED ORDER — ONDANSETRON HCL 4 MG/2ML IJ SOLN
INTRAMUSCULAR | Status: DC | PRN
  Administered 2015-03-08: 4 mg via INTRAVENOUS

## 2015-03-08 MED ORDER — HYDROMORPHONE HCL 1 MG/ML IJ SOLN
INTRAMUSCULAR | Status: AC
  Filled 2015-03-08: qty 1

## 2015-03-08 MED ORDER — HYDROMORPHONE HCL 1 MG/ML IJ SOLN: 1.0000 mg | INTRAMUSCULAR | Status: DC | PRN

## 2015-03-08 MED ORDER — CEFAZOLIN SODIUM-DEXTROSE 2-3 GM-% IV SOLR
INTRAVENOUS | Status: AC
  Filled 2015-03-08: qty 50

## 2015-03-08 MED ORDER — GLYCOPYRROLATE 0.2 MG/ML IJ SOLN: 0.2000 mg | Freq: Once | INTRAMUSCULAR | Status: AC | PRN

## 2015-03-08 MED ORDER — MIDAZOLAM HCL 2 MG/2ML IJ SOLN
INTRAMUSCULAR | Status: AC
  Filled 2015-03-08: qty 2

## 2015-03-08 MED ORDER — LACTATED RINGERS IV SOLN
INTRAVENOUS | Status: DC
  Administered 2015-03-08 (×2): via INTRAVENOUS

## 2015-03-08 MED ORDER — FENTANYL CITRATE (PF) 100 MCG/2ML IJ SOLN
INTRAMUSCULAR | Status: AC
  Filled 2015-03-08: qty 2

## 2015-03-08 MED ORDER — FENTANYL CITRATE (PF) 100 MCG/2ML IJ SOLN
50.0000 ug | INTRAMUSCULAR | Status: DC | PRN
  Administered 2015-03-08: 2 ug via INTRAVENOUS

## 2015-03-08 MED ORDER — HYDROCODONE-ACETAMINOPHEN 5-325 MG PO TABS
2.0000 | ORAL_TABLET | ORAL | Status: DC | PRN
  Administered 2015-03-08 – 2015-03-09 (×2): 2 via ORAL
  Filled 2015-03-08 (×2): qty 2

## 2015-03-08 MED ORDER — TECHNETIUM TC 99M SULFUR COLLOID FILTERED
1.0000 | Freq: Once | INTRAVENOUS | Status: AC | PRN
  Administered 2015-03-08: 1 via INTRADERMAL

## 2015-03-08 MED ORDER — SODIUM CHLORIDE 0.9 % IJ SOLN
INTRAMUSCULAR | Status: AC
  Filled 2015-03-08: qty 10

## 2015-03-08 MED ORDER — FENTANYL CITRATE (PF) 100 MCG/2ML IJ SOLN
INTRAMUSCULAR | Status: AC
  Filled 2015-03-08: qty 8

## 2015-03-08 MED ORDER — BUPIVACAINE-EPINEPHRINE 0.25% -1:200000 IJ SOLN: INTRAMUSCULAR | Status: DC | PRN

## 2015-03-08 MED ORDER — ONDANSETRON HCL 4 MG/2ML IJ SOLN
4.0000 mg | Freq: Once | INTRAMUSCULAR | Status: AC | PRN
Start: 2015-03-08 — End: 2015-03-08

## 2015-03-08 MED ORDER — METHYLENE BLUE 1 % INJ SOLN
INTRAMUSCULAR | Status: AC
Start: 2015-03-08 — End: 2015-03-08
  Filled 2015-03-08: qty 10

## 2015-03-08 MED ORDER — CEFAZOLIN SODIUM-DEXTROSE 2-3 GM-% IV SOLR
2.0000 g | INTRAVENOUS | Status: AC
  Administered 2015-03-08: 2 g via INTRAVENOUS

## 2015-03-08 MED ORDER — CHLORHEXIDINE GLUCONATE 4 % EX LIQD: 1.0000 "application " | Freq: Once | CUTANEOUS | Status: DC

## 2015-03-08 MED ORDER — ONDANSETRON HCL 4 MG/2ML IJ SOLN: 4.0000 mg | Freq: Four times a day (QID) | INTRAMUSCULAR | Status: DC | PRN

## 2015-03-08 MED ORDER — DEXAMETHASONE SODIUM PHOSPHATE 4 MG/ML IJ SOLN
INTRAMUSCULAR | Status: DC | PRN
  Administered 2015-03-08: 10 mg via INTRAVENOUS

## 2015-03-08 MED ORDER — HYDROCODONE-ACETAMINOPHEN 5-325 MG PO TABS: 1.0000 | ORAL_TABLET | Freq: Four times a day (QID) | ORAL | Status: DC | PRN

## 2015-03-08 MED ORDER — ALPRAZOLAM 0.25 MG PO TABS
0.2500 mg | ORAL_TABLET | Freq: Every day | ORAL | Status: AC
  Administered 2015-03-08: 0.25 mg via ORAL
  Filled 2015-03-08: qty 1

## 2015-03-08 MED ORDER — FENTANYL CITRATE (PF) 100 MCG/2ML IJ SOLN
INTRAMUSCULAR | Status: DC | PRN
  Administered 2015-03-08: 50 ug via INTRAVENOUS

## 2015-03-08 MED ORDER — LACTATED RINGERS IV SOLN: INTRAVENOUS | Status: DC

## 2015-03-08 MED ORDER — LIDOCAINE HCL (CARDIAC) 20 MG/ML IV SOLN
INTRAVENOUS | Status: DC | PRN
  Administered 2015-03-08: 30 mg via INTRAVENOUS

## 2015-03-08 MED ORDER — MEPERIDINE HCL 25 MG/ML IJ SOLN: 6.2500 mg | INTRAMUSCULAR | Status: DC | PRN

## 2015-03-08 MED ORDER — MIDAZOLAM HCL 2 MG/2ML IJ SOLN
1.0000 mg | INTRAMUSCULAR | Status: DC | PRN
  Administered 2015-03-08 (×2): 2 mg via INTRAVENOUS

## 2015-03-08 MED ORDER — PROPOFOL 10 MG/ML IV BOLUS
INTRAVENOUS | Status: DC | PRN
  Administered 2015-03-08: 150 mg via INTRAVENOUS

## 2015-03-08 MED ORDER — BUPIVACAINE-EPINEPHRINE (PF) 0.5% -1:200000 IJ SOLN
INTRAMUSCULAR | Status: DC | PRN
  Administered 2015-03-08: 30 mL via PERINEURAL

## 2015-03-08 MED ORDER — HYDROMORPHONE HCL 1 MG/ML IJ SOLN
0.2500 mg | INTRAMUSCULAR | Status: DC | PRN
  Administered 2015-03-08 (×2): 0.5 mg via INTRAVENOUS

## 2015-03-08 MED ORDER — BUPIVACAINE HCL (PF) 0.25 % IJ SOLN
INTRAMUSCULAR | Status: AC
  Filled 2015-03-08: qty 30

## 2015-03-08 MED ORDER — BUPIVACAINE-EPINEPHRINE (PF) 0.25% -1:200000 IJ SOLN
INTRAMUSCULAR | Status: AC
  Filled 2015-03-08: qty 30

## 2015-03-08 SURGICAL SUPPLY — 68 items
APL SKNCLS STERI-STRIP NONHPOA (GAUZE/BANDAGES/DRESSINGS)
APPLIER CLIP 11 MED OPEN (CLIP)
APPLIER CLIP 9.375 MED OPEN (MISCELLANEOUS)
APR CLP MED 11 20 MLT OPN (CLIP)
APR CLP MED 9.3 20 MLT OPN (MISCELLANEOUS)
BANDAGE ELASTIC 6 VELCRO ST LF (GAUZE/BANDAGES/DRESSINGS) IMPLANT
BENZOIN TINCTURE PRP APPL 2/3 (GAUZE/BANDAGES/DRESSINGS) ×1 IMPLANT
BINDER BREAST LRG (GAUZE/BANDAGES/DRESSINGS) ×1 IMPLANT
BINDER BREAST MEDIUM (GAUZE/BANDAGES/DRESSINGS) IMPLANT
BINDER BREAST XLRG (GAUZE/BANDAGES/DRESSINGS) IMPLANT
BINDER BREAST XXLRG (GAUZE/BANDAGES/DRESSINGS) IMPLANT
BLADE CLIPPER SURG (BLADE) ×1 IMPLANT
BLADE HEX COATED 2.75 (ELECTRODE) ×1 IMPLANT
BLADE SURG 10 STRL SS (BLADE) ×2 IMPLANT
BLADE SURG 15 STRL LF DISP TIS (BLADE) ×1 IMPLANT
BLADE SURG 15 STRL SS (BLADE) ×2
CANISTER SUCT 1200ML W/VALVE (MISCELLANEOUS) ×2 IMPLANT
CHLORAPREP W/TINT 26ML (MISCELLANEOUS) ×2 IMPLANT
CLIP APPLIE 11 MED OPEN (CLIP) IMPLANT
CLIP APPLIE 9.375 MED OPEN (MISCELLANEOUS) IMPLANT
COVER BACK TABLE 60X90IN (DRAPES) ×2 IMPLANT
COVER MAYO STAND STRL (DRAPES) ×2 IMPLANT
COVER PROBE W GEL 5X96 (DRAPES) ×2 IMPLANT
DECANTER SPIKE VIAL GLASS SM (MISCELLANEOUS) IMPLANT
DRAIN CHANNEL 19F RND (DRAIN) ×2 IMPLANT
DRAPE LAPAROSCOPIC ABDOMINAL (DRAPES) ×2 IMPLANT
DRAPE UTILITY XL STRL (DRAPES) ×2 IMPLANT
DRSG PAD ABDOMINAL 8X10 ST (GAUZE/BANDAGES/DRESSINGS) ×2 IMPLANT
ELECT COATED BLADE 2.86 ST (ELECTRODE) ×1 IMPLANT
ELECT REM PT RETURN 9FT ADLT (ELECTROSURGICAL) ×2
ELECTRODE REM PT RTRN 9FT ADLT (ELECTROSURGICAL) ×1 IMPLANT
EVACUATOR SILICONE 100CC (DRAIN) ×2 IMPLANT
FILTER 7/8 IN (FILTER) ×1 IMPLANT
GAUZE SPONGE 4X4 12PLY STRL (GAUZE/BANDAGES/DRESSINGS) ×2 IMPLANT
GLOVE BIO SURGEON STRL SZ7 (GLOVE) ×1 IMPLANT
GLOVE BIOGEL PI IND STRL 7.5 (GLOVE) IMPLANT
GLOVE BIOGEL PI INDICATOR 7.5 (GLOVE) ×1
GLOVE EXAM NITRILE LRG STRL (GLOVE) ×1 IMPLANT
GLOVE SURG SIGNA 7.5 PF LTX (GLOVE) ×3 IMPLANT
GOWN STRL REUS W/ TWL LRG LVL3 (GOWN DISPOSABLE) ×2 IMPLANT
GOWN STRL REUS W/TWL LRG LVL3 (GOWN DISPOSABLE) ×4
KIT MARKER MARGIN INK (KITS) ×1 IMPLANT
LIQUID BAND (GAUZE/BANDAGES/DRESSINGS) ×1 IMPLANT
NDL HYPO 25X1 1.5 SAFETY (NEEDLE) ×1 IMPLANT
NEEDLE HYPO 22GX1.5 SAFETY (NEEDLE) IMPLANT
NEEDLE HYPO 25X1 1.5 SAFETY (NEEDLE) ×2 IMPLANT
NS IRRIG 1000ML POUR BTL (IV SOLUTION) ×2 IMPLANT
PACK BASIN DAY SURGERY FS (CUSTOM PROCEDURE TRAY) ×2 IMPLANT
PENCIL BUTTON HOLSTER BLD 10FT (ELECTRODE) IMPLANT
PIN SAFETY STERILE (MISCELLANEOUS) ×2 IMPLANT
SHEET MEDIUM DRAPE 40X70 STRL (DRAPES) ×1 IMPLANT
SLEEVE SCD COMPRESS KNEE MED (MISCELLANEOUS) ×1 IMPLANT
SPONGE LAP 18X18 X RAY DECT (DISPOSABLE) ×2 IMPLANT
SPONGE LAP 4X18 X RAY DECT (DISPOSABLE) IMPLANT
STRIP CLOSURE SKIN 1/2X4 (GAUZE/BANDAGES/DRESSINGS) ×2 IMPLANT
SUT ETHILON 2 0 FS 18 (SUTURE) ×2 IMPLANT
SUT MNCRL AB 4-0 PS2 18 (SUTURE) ×1 IMPLANT
SUT MON AB 5-0 PS2 18 (SUTURE) ×1 IMPLANT
SUT SILK 2 0 SH (SUTURE) ×1 IMPLANT
SUT VIC AB 3-0 SH 27 (SUTURE) ×2
SUT VIC AB 3-0 SH 27X BRD (SUTURE) ×1 IMPLANT
SUT VICRYL 3-0 CR8 SH (SUTURE) ×3 IMPLANT
SYR CONTROL 10ML LL (SYRINGE) ×2 IMPLANT
TOWEL OR 17X24 6PK STRL BLUE (TOWEL DISPOSABLE) ×4 IMPLANT
TOWEL OR NON WOVEN STRL DISP B (DISPOSABLE) ×2 IMPLANT
TUBE CONNECTING 20X1/4 (TUBING) ×2 IMPLANT
VAC PENCILS W/TUBING CLEAR (MISCELLANEOUS) ×1 IMPLANT
YANKAUER SUCT BULB TIP NO VENT (SUCTIONS) ×2 IMPLANT

## 2015-03-08 NOTE — Op Note (Signed)
03/08/2015  4:39 PM  PATIENT:  Riley Murray, 64 y.o., male, MRN: [id number]  PREOP DIAGNOSIS:  left breast cancer  POSTOP DIAGNOSIS:   Left breast cancer, 11 o'clock position (T1, N0)  PROCEDURE:   Procedure(s):  LEFT MASTECTOMY WITH LEFT AXILLARY SENTINEL LYMPH NODE BIOPSY  SURGEON:   Alphonsa Overall, M.D.  ASSISTANT:   None  ANESTHESIA:   general  Anesthesiologist: Nolon Nations, MD; Lillia Abed, MD CRNA: Ernesta Amble Blocker, CRNA  General  ASA:  2  EBL:  minimal  ml  BLOOD ADMINISTERED: none  DRAINS: 19 F Blake drain  LOCAL MEDICATIONS USED:   Pectoral block by Dr. Fredirick Maudlin  SPECIMEN:   Left breast (long white suture later, short black suture cranial), left axillary lymph node biopsy (counts 2100 and 610, background 30)  COUNTS CORRECT:  YES  INDICATIONS FOR PROCEDURE:  Riley Murray is a 64 y.o. (DOB: 1951/09/24) white  male whose primary care physician is Precious Reel, MD and comes for left mastectomy and left axillary sentinel lymph node biopsy.   The patient has seen Dr. Darnell Level Magrinat from oncology and is to see Dr. Melina Modena for rad oncology.   The indications and risks of the surgery were explained to the patient.  The risks include, but are not limited to, infection, bleeding, and nerve injury.  OPERATIVE NOTE;  The patient was taken to room # 8 at [facility] where he underwent a general anesthesia  supervised by Anesthesiologist: Nolon Nations, MD; Lillia Abed, MD CRNA: Ernesta Amble Blocker, CRNA. His left breast and axilla were prepped with  ChloraPrep and sterilely draped.    A time-out and the surgical check list was reviewed.    I found an obvious hot node with the Neoprobe, so I did not inject methylene blue.   I made an elliptical incision including the areola in the left breast.  I developed skin flaps about 3 cm away from the breast tissue into the fat under the skin of the chest.  The breast was reflected off the pectoralis muscle from medial to lateral.   He had a palpable nodule at the 11 o'clock position of the areola of the left breast.   I tried to make sure my dissection was at least 3 cm away from the palpable node and I excised the skin over the nodule. A long white suture was placed on the lateral aspect of the breast and a short black suture at the cranial aspect of the mastectomy specimen.   I made a separate incision in the left axilla.  I dissected into the left axilla and found a sentinel lymph node.  There were 2 nodes which had counts of 2100 and 610 with a background count of 30.   This was sent as a separate specimen.   I brought out one 77 F Blake drains below the inferior flaps.  This was sewn in place with a 2-0 Nylon.  I irrigated the wound with 1,000 cc of fluid.   The skin at each incision was closed with interrupted 3-0 Vicryl sutures and the skin was closed with a 4-0 Monocryl.  The wounds were painted with Liquiband.    A pressure dressing was placed on the wound and the chest wrapped with a breast binder.  Her needle and sponge count were correct at the end of the case.   He was transferred to the recovery room in good condition.  I'll see how he does in recovery to see if  he needs to spend the night.  Alphonsa Overall, MD, [facility] Surgery Pager: [phone] Office phone:  [phone]

## 2015-03-08 NOTE — Anesthesia Procedure Notes (Addendum)
Anesthesia Regional Block:  Pectoralis block  Pre-Anesthetic Checklist: ,, timeout performed, Correct Patient, Correct Site, Correct Laterality, Correct Procedure, Correct Position, site marked, Risks and benefits discussed,  Surgical consent,  Pre-op evaluation,  At surgeon's request and post-op pain management  Laterality: Left  Prep: chloraprep       Needles:  Injection technique: Single-shot     Needle Length: 9cm 9 cm Needle Gauge: 21 and 21 G    Additional Needles:  Procedures: ultrasound guided (picture in chart) Pectoralis block Narrative:  Start time: 03/08/2015 2:15 PM End time: 03/08/2015 2:25 PM Injection made incrementally with aspirations every 5 mL.  Performed by: Personally  Anesthesiologist: Lillia Abed  Additional Notes: Monitors applied. Patient sedated. Sterile prep and drape,hand hygiene and sterile gloves were used. Relevant anatomy identified.Needle position confirmed.Local anesthetic injected incrementally after negative aspiration. Local anesthetic spread visualized. Vascular puncture avoided. No complications. Image printed for medical record.The patient tolerated the procedure well.       Procedure Name: LMA Insertion Date/Time: 03/08/2015 3:19 PM Performed by: Elynore Dolinski D Pre-anesthesia Checklist: Patient identified, Emergency Drugs available, Suction available and Patient being monitored Patient Re-evaluated:Patient Re-evaluated prior to inductionOxygen Delivery Method: Circle System Utilized Preoxygenation: Pre-oxygenation with 100% oxygen Intubation Type: IV induction Ventilation: Mask ventilation without difficulty LMA: LMA inserted LMA Size: 4.0 Number of attempts: 1 Airway Equipment and Method: Bite block Placement Confirmation: positive ETCO2 Tube secured with: Tape Dental Injury: Teeth and Oropharynx as per pre-operative assessment

## 2015-03-08 NOTE — Discharge Instructions (Signed)
[facility] - DISCHARGE INSTRUCTIONS TO PATIENT  Activity:  Driving - May drive in 2 or 3 days, if doing well.   Lifting - Use your arms as normal, but no heavy lifting (>15 pounds) until the drain is out  Wound Care:   Leave dry for 2 days, then may remove the bandage and shower.          Wear the breast binder until the drain is out.        Empty the drain twice a day and record the amount.  Diet:  As tolerated  Follow up appointment:  Call Dr. Pollie Friar office [facility]) at [phone] for an appointment for Wednesday, 5/18, 9:15 AM.  Medications and dosages:  Resume your home medications.  You have a prescription for:  vicodin  Call Dr. Lucia Gaskins or his office  [phone]) if you have:  Temperature greater than 100.4,  Persistent nausea and vomiting,  Severe uncontrolled pain,  Redness, tenderness, or signs of infection (pain, swelling, redness, odor or green/yellow discharge around the site),  Difficulty breathing, headache or visual disturbances,  Any other questions or concerns you may have after discharge.  In an emergency, call 911 or go to an Emergency Department at a nearby hospital.    Post Anesthesia Home Care Instructions  Activity: Get plenty of rest for the remainder of the day. A responsible adult should stay with you for 24 hours following the procedure.  For the next 24 hours, DO NOT: -Drive a car -Paediatric nurse -Drink alcoholic beverages -Take any medication unless instructed by your physician -Make any legal decisions or sign important papers.  Meals: Start with liquid foods such as gelatin or soup. Progress to regular foods as tolerated. Avoid greasy, spicy, heavy foods. If nausea and/or vomiting occur, drink only clear liquids until the nausea and/or vomiting subsides. Call your physician if vomiting continues.  Special Instructions/Symptoms: Your throat may feel dry or sore from the anesthesia or the breathing tube  placed in your throat during surgery. If this causes discomfort, gargle with warm salt water. The discomfort should disappear within 24 hours.  If you had a scopolamine patch placed behind your ear for the management of post- operative nausea and/or vomiting:  1. The medication in the patch is effective for 72 hours, after which it should be removed.  Wrap patch in a tissue and discard in the trash. Wash hands thoroughly with soap and water. 2. You may remove the patch earlier than 72 hours if you experience unpleasant side effects which may include dry mouth, dizziness or visual disturbances. 3. Avoid touching the patch. Wash your hands with soap and water after contact with the patch.   About my Jackson-Pratt Bulb Drain  What is a Jackson-Pratt bulb? A Jackson-Pratt is a soft, round device used to collect drainage. It is connected to a long, thin drainage catheter, which is held in place by one or two small stiches near your surgical incision site. When the bulb is squeezed, it forms a vacuum, forcing the drainage to empty into the bulb.  Emptying the Jackson-Pratt bulb- To empty the bulb: 1. Release the plug on the top of the bulb. 2. Pour the bulb's contents into a measuring container which your nurse will provide. 3. Record the time emptied and amount of drainage. Empty the drain(s) as often as your     doctor or nurse recommends.  Date  Time                    Amount (Drain 1)                 Amount (Drain  2)  _____________________________________________________________________  _____________________________________________________________________  _____________________________________________________________________  _____________________________________________________________________  _____________________________________________________________________  _____________________________________________________________________  _____________________________________________________________________  _____________________________________________________________________  Squeezing the Jackson-Pratt Bulb- To squeeze the bulb: 1. Make sure the plug at the top of the bulb is open. 2. Squeeze the bulb tightly in your fist. You will hear air squeezing from the bulb. 3. Replace the plug while the bulb is squeezed. 4. Use a safety pin to attach the bulb to your clothing. This will keep the catheter from     pulling at the bulb insertion site.  When to call your doctor- Call your doctor if:  Drain site becomes red, swollen or hot.  You have a fever greater than 101 degrees F.  There is oozing at the drain site.  Drain falls out (apply a guaze bandage over the drain hole and secure it with tape).  Drainage increases daily not related to activity patterns. (You will usually have more drainage when you are active than when you are resting.)  Drainage has a bad odor.

## 2015-03-08 NOTE — Interval H&P Note (Signed)
History and Physical Interval Note:  03/08/2015 3:13 PM  Riley Murray  has presented today for surgery, with the diagnosis of left breast cancer  The various methods of treatment have been discussed with the patient and family.  Wife at bedside.  The patient is comfortable going ahead for now.  Questions answered.  After consideration of risks, benefits and other options for treatment, the patient has consented to  Procedure(s): LEFT MASTECTOMY WITH LEFT AXILLARY SENTINEL LYMPH NODE BIOPSY (Left) as a surgical intervention .  The patient's history has been reviewed, patient examined, no change in status, stable for surgery.  I have reviewed the patient's chart and labs.  Questions were answered to the patient's satisfaction.     Sherian Valenza H

## 2015-03-08 NOTE — Anesthesia Preprocedure Evaluation (Addendum)

## 2015-03-08 NOTE — Progress Notes (Signed)
Assisted Dr. Ossey with left, ultrasound guided, pectoralis block. Side rails up, monitors on throughout procedure. See vital signs in flow sheet. Tolerated Procedure well. 

## 2015-03-08 NOTE — Transfer of Care (Signed)
Immediate Anesthesia Transfer of Care Note  Patient: Riley Murray  Procedure(s) Performed: Procedure(s): LEFT MASTECTOMY WITH LEFT AXILLARY SENTINEL LYMPH NODE BIOPSY (Left)  Patient Location: PACU  Anesthesia Type:GA combined with regional for post-op pain  Level of Consciousness: awake and patient cooperative  Airway & Oxygen Therapy: Patient Spontanous Breathing and Patient connected to face mask oxygen  Post-op Assessment: Report given to RN and Post -op Vital signs reviewed and stable  Post vital signs: Reviewed and stable  Last Vitals:  Filed Vitals:   03/08/15 1647  BP:   Pulse: 70  Temp:   Resp:     Complications: No apparent anesthesia complications

## 2015-03-09 ENCOUNTER — Encounter: Payer: Self-pay | Admitting: Genetic Counselor

## 2015-03-09 ENCOUNTER — Encounter (HOSPITAL_BASED_OUTPATIENT_CLINIC_OR_DEPARTMENT_OTHER): Payer: Self-pay | Admitting: Surgery

## 2015-03-09 ENCOUNTER — Telehealth: Payer: Self-pay | Admitting: Genetic Counselor

## 2015-03-09 DIAGNOSIS — C50222 Malignant neoplasm of upper-inner quadrant of left male breast: Secondary | ICD-10-CM

## 2015-03-09 DIAGNOSIS — Z1379 Encounter for other screening for genetic and chromosomal anomalies: Secondary | ICD-10-CM | POA: Insufficient documentation

## 2015-03-09 NOTE — Progress Notes (Signed)
HPI: Riley Murray was previously seen in the [place] clinic due to a personal history of male breast cancer and concerns regarding a hereditary predisposition to cancer. Please refer to our prior cancer genetics clinic note for more information regarding Riley Murray medical, social and family histories, and our assessment and recommendations, at the time. Riley Murray recent genetic test results were disclosed to him, as were recommendations warranted by these results. These results and recommendations are discussed in more detail below.  GENETIC TEST RESULTS: At the time of Riley Murray visit, we recommended he pursue genetic testing of the Breast High/Moderate Risk gene panel. The Breast High/Moderate Risk gene panel offered by [facility] includes sequencing and deletion/duplication analysis of the following 9 genes: ATM, BRCA1, BRCA2, CDH1, CHEK2, PALB2, PTEN, STK11, and TP53.  The report date is Mar 10, 2015.Marland Kitchen  Genetic testing was normal, and did not reveal a deleterious mutation in these genes. The test report has been scanned into EPIC and is located under the Media tab.   We discussed with Riley Murray that since the current genetic testing is not perfect, it is possible there may be a gene mutation in one of these genes that current testing cannot detect, but that chance is small. We also discussed, that it is possible that another gene that has not yet been discovered, or that we have not yet tested, is responsible for the cancer diagnoses in the family, and it is, therefore, important to remain in touch with cancer genetics in the future so that we can continue to offer Riley Murray the most up to date genetic testing.   ADDITIONAL GENETIC TESTING: We discussed with Riley Murray that there are other genes that are associated with increased cancer risk that can be analyzed. The laboratories that offer such testing look at these additional genes via a hereditary cancer gene panel. Should Riley Murray  wish to pursue additional genetic testing, we are happy to discuss and coordinate this testing, at any time.    CANCER SCREENING RECOMMENDATIONS: This result is reassuring and indicates that Riley Murray likely does not have an increased risk for a future cancer due to a mutation in one of these genes. This normal test also suggests that Riley Murray cancer was most likely not due to an inherited predisposition associated with one of these genes.  Most cancers happen by chance and this negative test suggests that his cancer falls into this category.  We, therefore, recommended he continue to follow the cancer management and screening guidelines provided by his oncology and primary healthcare provider.   RECOMMENDATIONS FOR FAMILY MEMBERS: Women in this family might be at some increased risk of developing cancer, over the general population risk, simply due to the family history of cancer. We recommended women in this family have a yearly mammogram beginning at age 26, or 71 years younger than the earliest onset of cancer, an an annual clinical breast exam, and perform monthly breast self-exams. Women in this family should also have a gynecological exam as recommended by their primary provider. All family members should have a colonoscopy by age 16.  FOLLOW-UP: Lastly, we discussed with Riley Murray that cancer genetics is a rapidly advancing field and it is possible that new genetic tests will be appropriate for him and/or his family members in the future. We encouraged him to remain in contact with cancer genetics on an annual basis so we can update his personal and family histories and let him know of  advances in cancer genetics that may benefit this family.   Our contact number was provided. Riley Murray questions were answered to his satisfaction, and she knows he is welcome to call us at anytime with additional questions or concerns.   Roma Kayser, MS, [facility] Certified Genetic  Counselor [email or web address]

## 2015-03-09 NOTE — Discharge Summary (Signed)
Physician Discharge Summary  Patient ID:  Riley Murray  MRN: [id number]  DOB/AGE: [birth year] 64 y.o.  Admit date: 03/08/2015 Discharge date: 03/09/2015  Discharge Diagnoses:  1.  Left breast cancer  Active Problems:   Breast cancer  Operation: Procedure(s):  LEFT MASTECTOMY WITH LEFT AXILLARY SENTINEL LYMPH NODE BIOPSY on 03/08/2015 - [facility]  Discharged Condition: good  Hospital Course: Bradin Mcadory is an 64 y.o. male whose primary care physician is Precious Reel, MD and who was admitted 03/08/2015 with a chief complaint of No chief complaint on file. Marland Kitchen   He was brought to the operating room on 03/08/2015 and underwent  LEFT MASTECTOMY WITH LEFT AXILLARY SENTINEL LYMPH NODE BIOPSY.   He did spend the night at the surgical center.  He has some left axillary discomfort. He is ready to go home.  The discharge instructions were reviewed with the patient.  Consults: None  Significant Diagnostic Studies: Results for orders placed or performed during the hospital encounter of 03/08/15  Hemoglobin-hemacue, POC  Result Value Ref Range   Hemoglobin 13.9 13.0 - 17.0 g/dL    Nm Sentinel Node Inj-no Rpt (breast)  03/08/2015   CLINICAL DATA: left breast cancer   Sulfur colloid was injected intradermally by the nuclear medicine  technologist for breast cancer sentinel node localization.    US Breast Ltd Uni Left Inc Axilla  02/23/2015   CLINICAL DATA:  Patient recently noted a small palpable mass within the upper inner quadrant of the left breast in a periareolar location. There is no family history of breast cancer.  EXAM: DIGITAL DIAGNOSTIC BILATERAL MAMMOGRAM WITH 3D TOMOSYNTHESIS WITH CAD  ULTRASOUND LEFT BREAST  COMPARISON:  None.  ACR Breast Density Category a: The breast tissue is almost entirely fatty.  FINDINGS: There is an irregular mass with pleomorphic calcifications located within the upper inner quadrant of the left breast in the periareolar location. By mammography the mass  measures 1.1 cm in size. There are no additional findings within either breast.  Mammographic images were processed with CAD.  On physical exam, there is a firm, mobile, palpable mass located within the left breast at the 11 o'clock position 2 cm from the nipple which by physical examination measures approximately 1 cm in size.  Targeted ultrasound is performed, showing an irregularly marginated heterogeneous mass located within the left breast at the 11 o'clock position 2 cm from the nipple corresponding to the palpable finding. This is associated with increased vascularity and small areas of calcification. The mass measures 1.1 x 1.1 x 0.8 cm in size. This is a suspicious mass and tissue sampling is recommended. I have discussed ultrasound-guided core biopsy of the mass with the patient. This will be scheduled per patient preference.  Ultrasound of the left axilla demonstrates normal axillary contents and no evidence for adenopathy.  IMPRESSION: 1.1 cm suspicious mass located within the left breast at the 11 o'clock position 2 cm from the nipple. Tissue sampling is recommended and ultrasound-guided core biopsy will be scheduled.  RECOMMENDATION: Left breast ultrasound-guided core biopsy.  I have discussed the findings and recommendations with the patient. Results were also provided in writing at the conclusion of the visit. If applicable, a reminder letter will be sent to the patient regarding the next appointment.  BI-RADS CATEGORY  Category 4C: High suspicion for malignancy.   Electronically Signed   By: Altamese Cabal M.D.   On: 02/23/2015 15:49   Mm Diag Breast Tomo Bilateral  02/23/2015   CLINICAL DATA:  Patient recently noted a small palpable mass within the upper inner quadrant of the left breast in a periareolar location. There is no family history of breast cancer.  EXAM: DIGITAL DIAGNOSTIC BILATERAL MAMMOGRAM WITH 3D TOMOSYNTHESIS WITH CAD  ULTRASOUND LEFT BREAST  COMPARISON:  None.  ACR Breast  Density Category a: The breast tissue is almost entirely fatty.  FINDINGS: There is an irregular mass with pleomorphic calcifications located within the upper inner quadrant of the left breast in the periareolar location. By mammography the mass measures 1.1 cm in size. There are no additional findings within either breast.  Mammographic images were processed with CAD.  On physical exam, there is a firm, mobile, palpable mass located within the left breast at the 11 o'clock position 2 cm from the nipple which by physical examination measures approximately 1 cm in size.  Targeted ultrasound is performed, showing an irregularly marginated heterogeneous mass located within the left breast at the 11 o'clock position 2 cm from the nipple corresponding to the palpable finding. This is associated with increased vascularity and small areas of calcification. The mass measures 1.1 x 1.1 x 0.8 cm in size. This is a suspicious mass and tissue sampling is recommended. I have discussed ultrasound-guided core biopsy of the mass with the patient. This will be scheduled per patient preference.  Ultrasound of the left axilla demonstrates normal axillary contents and no evidence for adenopathy.  IMPRESSION: 1.1 cm suspicious mass located within the left breast at the 11 o'clock position 2 cm from the nipple. Tissue sampling is recommended and ultrasound-guided core biopsy will be scheduled.  RECOMMENDATION: Left breast ultrasound-guided core biopsy.  I have discussed the findings and recommendations with the patient. Results were also provided in writing at the conclusion of the visit. If applicable, a reminder letter will be sent to the patient regarding the next appointment.  BI-RADS CATEGORY  Category 4C: High suspicion for malignancy.   Electronically Signed   By: Altamese Cabal M.D.   On: 02/23/2015 15:49    Discharge Exam:  Filed Vitals:   03/09/15 0700  BP: 130/77  Pulse: 68  Temp: 97 F (36.1 C)  Resp: 16     General: WN WM who is alert and generally healthy appearing.  Lungs: Clear to auscultation and symmetric breath sounds. Chest:  Wound looks good.  Drainage: 5 cc last PM  Discharge Medications:     Medication List    TAKE these medications        ALPRAZolam 0.25 MG tablet  Commonly known as:  XANAX  Take 0.25 mg by mouth at bedtime as needed.     cholecalciferol 1000 UNITS tablet  Commonly known as:  VITAMIN D  Take 1,000 Units by mouth daily.     citalopram 10 MG tablet  Commonly known as:  CELEXA  Take 10 mg by mouth daily.     fish oil-omega-3 fatty acids 1000 MG capsule  Take 1 g by mouth daily.     Flaxseed (Linseed) Oil  Take 1,200 mg by mouth.     Garlic Oil 0623 MG Caps  Take 1 capsule by mouth.     HYDROcodone-acetaminophen 5-325 MG per tablet  Commonly known as:  NORCO/VICODIN  Take 1-2 tablets by mouth every 6 (six) hours as needed.     MULTIPLE VITAMIN PO  Take 1 tablet by mouth.     Red Yeast Rice 600 MG Caps  Take 1 capsule by mouth.     vitamin C 500  MG tablet  Commonly known as:  ASCORBIC ACID  Take 500 mg by mouth daily.        Disposition: Final discharge disposition not confirmed      Discharge Instructions    Diet - low sodium heart healthy    Complete by:  As directed      Diet - low sodium heart healthy    Complete by:  As directed      Increase activity slowly    Complete by:  As directed      Increase activity slowly    Complete by:  As directed             Signed: Alphonsa Overall, M.D., [facility] Office:  [phone]  03/09/2015, 7:32 AM

## 2015-03-09 NOTE — Anesthesia Postprocedure Evaluation (Signed)
Anesthesia Post Note  Patient: Riley Murray  Procedure(s) Performed: Procedure(s) (LRB): LEFT MASTECTOMY WITH LEFT AXILLARY SENTINEL LYMPH NODE BIOPSY (Left)  Anesthesia type: General  Patient location: PACU  Post pain: Pain level controlled  Post assessment: Post-op Vital signs reviewed  Last Vitals: BP 130/77 mmHg  Pulse 68  Temp(Src) 36.1 C (Oral)  Resp 16  Ht 5\' 10"  (1.778 m)  Wt 169 lb (76.658 kg)  BMI 24.25 kg/m2  SpO2 95%  Post vital signs: Reviewed  Level of consciousness: sedated  Complications: No apparent anesthesia complications

## 2015-03-09 NOTE — Telephone Encounter (Signed)
Revealed negative genetic testing on the Breast High/moderate risk panel.

## 2015-03-10 ENCOUNTER — Other Ambulatory Visit: Payer: Self-pay | Admitting: Oncology

## 2015-03-15 ENCOUNTER — Telehealth: Payer: Self-pay | Admitting: *Deleted

## 2015-03-15 NOTE — Telephone Encounter (Signed)
Received order for oncotype testing per Dr. Jana Hakim. PAC sent to [facility]. Requisition sent to pathology. Received by Alyse Low. Since his testing will not be back by 03/25/15. His appt was scheduled and confirmed to 03/29/15.

## 2015-03-18 ENCOUNTER — Encounter: Payer: Self-pay | Admitting: Radiation Oncology

## 2015-03-18 NOTE — Progress Notes (Addendum)
Location of Breast Cancer: Left Breast  Histology per Pathology Report: Left Breast  03/08/15 Diagnosis 1. Lymph node, sentinel, biopsy, left axillary - ONE BENIGN LYMPH NODE WITH NO TUMOR SEEN (0/1). - SEE COMMENT. 2. Breast, simple mastectomy, left - INVASIVE GRADE I DUCTAL CARCINOMA WITH CALCIFICATIONS SPANNING 0.9 CM IN GREATEST DIMENSION. - MARGINS ARE NEGATIVE. - SEE ONCOLOGY TEMPLATE. 3. Lymph node, sentinel, biopsy, left 1 of 4 FINAL for Spoon, [place] ([id number]) Diagnosis(continued) - ONE BENIGN LYMPH NODE WITH NO TUMOR SEEN (0/1). - SEE COMMENT. 4. Lymph node, sentinel, biopsy, left - ONE BENIGN LYMPH NODE WITH NO TUMOR SEEN (0/1).  Receptor Status: ER(100%), PR (89%), Her2-neu (Neg), Ki-67(15%)  Mr.Riley Murray was taking a shower mid April 2016 when he noted a lump in his left breast. He brought it to his wife's attention (she is a former patient of mine with her own history of breast cancer), and then to his physician who set him up for bilateral diagnostic mammography with tomosynthesis and left breast [facility] April 20 05/18/2015. Mammography showed a breast density category A. In the upper inner quadrant of the left breast there was an irregular mass measuring 1.1 cm. This was palpable and mobile. By ultrasound there was an irregularly marginated mass at this location in the left breast measuring 1.1 cm. The left axilla appeared normal sonographically.  Past/Anticipated interventions by surgeon, if any: Dr. Alphonsa Overall : Mastectomy Left Breast  Past/Anticipated interventions by medical oncology, if any: Chemotherapy - Dr. Lurline Del - waiting on Oncotype report -  Tamoxifen to follow completion of local therapy  Lymphedema issues, if any:  none  Pain issues, if any:  discomfort   SAFETY ISSUES:  Prior radiation? No  Pacemaker/ICD? No  Possible current pregnancy?N/A  Is the patient on methotrexate? No  Current Complaints /  other details:  Married, 2 daughters , wife breast ca x2 2009, tx by Dr. Sondra Come,  Father lung cancer  Allergies: NKA  Never smoker, No alcohol, No drugs  Mr. Beale spouse has a prior history of breast cancer tx 2009 radiation with Dr.Kinard   Deirdre Evener, RN 03/18/2015,1:46 PM  BP 119/66 mmHg  Pulse 75  Temp(Src) 97.5 F (36.4 C) (Oral)  Resp 16  Ht '5\' 10"'  (1.778 m)  Wt 171 lb 12.8 oz (77.928 kg)  BMI 24.65 kg/m2  Wt Readings from Last 3 Encounters:  03/04/15 169 lb 9.6 oz (76.93 kg)  03/05/12 168 lb (76.204 kg)  01/09/12 168 lb 6.4 oz (76.386 kg)

## 2015-03-22 ENCOUNTER — Ambulatory Visit
Admission: RE | Admit: 2015-03-22 | Discharge: 2015-03-22 | Disposition: A | Discharge status: Home / Self Care | Payer: BLUE CROSS/BLUE SHIELD | Source: Ambulatory Visit | Attending: Radiation Oncology | Admitting: Radiation Oncology

## 2015-03-22 ENCOUNTER — Encounter: Payer: Self-pay | Admitting: Radiation Oncology

## 2015-03-22 ENCOUNTER — Encounter (HOSPITAL_COMMUNITY): Payer: Self-pay

## 2015-03-22 VITALS — BP 119/66 | HR 75 | Temp 97.5°F | Resp 16 | Ht 70.0 in | Wt 171.8 lb

## 2015-03-22 DIAGNOSIS — C50222 Malignant neoplasm of upper-inner quadrant of left male breast: Secondary | ICD-10-CM | POA: Insufficient documentation

## 2015-03-22 NOTE — Progress Notes (Signed)
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 male who is seen today for evaluation of his T1 N0 invasive ductal carcinoma the left breast.  He noted left breast mass this past April for which she underwent mammography on 02/23/2015.  On examination he was felt to have a mass within the left breast at the 11:00 position, 27 m from the nipple measuring 1 cm in size.  On ultrasound this measured 1.1 x 1.1 x 0.8 cm.  An ultrasound-guided core biopsy on 02/23/2015 was diagnostic for invasive ductal carcinoma felt to be grade 1.  Calcifications were present.  His tumor was ER positive at 100%, PR positive at 89%, HER-2/neu negative with a Ki-67 of 15%.  He underwent a left mastectomy and sentinel lymph node biopsy on 03/08/2015.  He was found have a 0.9 cm invasive ductal carcinoma, grade 1.  3 sentinel lymph nodes were free of metastatic disease.  The closest margin was 0.4 cm, posteriorly.  There was no LV I or DCIS.  He underwent genetic testing and he was negative including BRCA2.  No history of gynecomastia or Klinefelter syndrome.  He was seen by Dr. Dr. Jana Hakim to his obtaining Oncotype DX testing, and the patient will probably be placed on adjuvant tamoxifen.  He is without complaints today except for improving left axillary soreness.  PREVIOUS RADIATION THERAPY: No   PAST MEDICAL HISTORY:  has a past medical history of Male breast cancer (03/08/15) and Breast cancer (02/24/15).     PAST SURGICAL HISTORY:  Past Surgical History  Procedure Laterality Date  . Lumbar disc removal  1991  . Colonoscopy    . Mastectomy w/ sentinel node biopsy Left  03/08/2015    Procedure: LEFT MASTECTOMY WITH LEFT AXILLARY SENTINEL LYMPH NODE BIOPSY;  Surgeon: Alphonsa Overall, MD;  Location: [place];  Service: General;  Laterality: Left;     FAMILY HISTORY: family history includes Diabetes in his mother; Lung cancer (age of onset: 75) in his father; Stroke in his mother. There is no history of Esophageal cancer, Rectal cancer, or Stomach cancer. His father died from lung cancer at 34.  He was a smoker.  His mother died following a stroke at age 60.  She had a history of diabetes.   SOCIAL HISTORY:  reports that he has never smoked. He has never used smokeless tobacco. He reports that he does not drink alcohol or use illicit drugs.  Married, 2 children.  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   ALLERGIES: Review of patient's allergies indicates no known allergies.   MEDICATIONS:  Current Outpatient Prescriptions  Medication Sig Dispense Refill  . ALPRAZolam (XANAX) 0.25 MG tablet Take 0.25 mg by mouth at bedtime as needed.    . cholecalciferol (VITAMIN D) 1000 UNITS tablet Take 1,000 Units by mouth daily.    . citalopram (CELEXA) 10 MG tablet Take 10 mg by mouth daily.    . fish oil-omega-3 fatty acids 1000 MG capsule Take 1 g by mouth daily.    Marland Kitchen  Flaxseed, Linseed, OIL Take 1,200 mg by mouth.    . Garlic Oil 7829 MG CAPS Take 1 capsule by mouth.    Marland Kitchen ibuprofen (ADVIL,MOTRIN) 200 MG tablet Take 200 mg by mouth every 6 (six) hours as needed.    . MULTIPLE VITAMIN PO Take 1 tablet by mouth.    . Red Yeast Rice 600 MG CAPS Take 1 capsule by mouth.    . vitamin C (ASCORBIC ACID) 500 MG tablet Take 500 mg by mouth daily.    Marland Kitchen HYDROcodone-acetaminophen (NORCO/VICODIN) 5-325 MG per tablet Take 1-2 tablets by mouth every 6 (six) hours as needed. (Patient not taking: Reported on 03/22/2015) 30 tablet 0   No current facility-administered medications for this encounter.   Facility-Administered Medications  Ordered in Other Encounters  Medication Dose Route Frequency Provider Last Rate Last Dose  . chlorhexidine (HIBICLENS) 4 % liquid 1 application  1 application Topical Once Alphonsa Overall, MD      . chlorhexidine (HIBICLENS) 4 % liquid 1 application  1 application Topical Once Alphonsa Overall, MD         REVIEW OF SYSTEMS:  Pertinent items are noted in HPI.    PHYSICAL EXAM:  height is _0  (1.778 m) and weight is 171 lb 12.8 oz (77.928 kg). His oral temperature is 97.5 F (36.4 C). His blood pressure is 119/66 and his pulse is 75. His respiration is 16.   Head neck examination: Grossly unremarkable.  Nodes: Without palpable cervical, supraclavicular, infraclavicular, or axillary lymphadenopathy.  His left axillary wound is healing well.  Chest: Lungs clear.  Breasts: Left mastectomy wound which is healing well.  No visible or palpable masses are appreciated.  Right breast without masses or lesions.  Extremities: Without edema.   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  His prognosis appears to be favorable.  His risk for local regional failure is related to tumor and nodal stage.  He does not have any risk factors for a local regional recurrence which I would expect to be less than 5%-10%.  Tamoxifen would be expected to decrease this risk even further.  He knows to perform self breast examinations going forward.   PLAN: As above.  I spent 20  minutes face to face with the patient and more than 50% of that time was spent in counseling  and/or coordination of care.

## 2015-03-22 NOTE — Progress Notes (Signed)
Please see the Nurse Progress Note in the MD Initial Consult Encounter for this patient. 

## 2015-03-25 ENCOUNTER — Ambulatory Visit: Payer: BLUE CROSS/BLUE SHIELD | Admitting: Oncology

## 2015-03-29 ENCOUNTER — Encounter: Payer: Self-pay | Admitting: *Deleted

## 2015-03-29 ENCOUNTER — Telehealth: Payer: Self-pay | Admitting: Oncology

## 2015-03-29 ENCOUNTER — Ambulatory Visit (HOSPITAL_BASED_OUTPATIENT_CLINIC_OR_DEPARTMENT_OTHER): Payer: BLUE CROSS/BLUE SHIELD | Admitting: Oncology

## 2015-03-29 ENCOUNTER — Telehealth: Payer: Self-pay | Admitting: *Deleted

## 2015-03-29 VITALS — BP 123/63 | HR 70 | Temp 98.1°F | Resp 18 | Ht 70.0 in | Wt 172.5 lb

## 2015-03-29 DIAGNOSIS — Z1379 Encounter for other screening for genetic and chromosomal anomalies: Secondary | ICD-10-CM

## 2015-03-29 DIAGNOSIS — C50222 Malignant neoplasm of upper-inner quadrant of left male breast: Secondary | ICD-10-CM

## 2015-03-29 MED ORDER — TAMOXIFEN CITRATE 20 MG PO TABS: 20.0000 mg | ORAL_TABLET | Freq: Every day | ORAL | Status: AC

## 2015-03-29 NOTE — Telephone Encounter (Signed)
Received oncotype score of 9/7%. Copy given to Dr. Jana Hakim.

## 2015-03-29 NOTE — Progress Notes (Signed)
[facility]  [phone] [phone]     ID: Riley Murray DOB: Jul 10, 1951  MR#: [id number]  YOV#:[id number]  Patient Care Team: Shon Baton, MD as PCP - General (Internal Medicine) PCP: Precious Reel, MD SU: Alphonsa Overall MD OTHER MD:  CHIEF COMPLAINT: Left-sided breast cancer  CURRENT TREATMENT: tamoxifen   BREAST CANCER HISTORY: From the original intake note: Riley Murray was taking a shower mid April 2016 when he noted a lump in his left breast. He brought it to his wife's attention (she is a former patient of mine with her own history of breast cancer), and then to his physician who set him up for bilateral diagnostic mammography with tomosynthesis and left breast ultrasonography at [facility] April 20 05/18/2015. Mammography showed a breast density category A. In the upper inner quadrant of the left breast there was an irregular mass measuring 1.1 cm. This was palpable and mobile. By ultrasound there was an irregularly marginated mass at this location in the left breast measuring 1.1 cm. The left axilla appeared normal sonographically.  Biopsy of the left breast mass in question 02/24/2015 showed (SAA [id number]) an invasive ductal carcinoma, grade 1, with a prognostic panel still pending.  The patient's subsequent history is as detailed below.  INTERVAL HISTORY: Riley Murray returns today for follow-up of her's breast cancer. Since his last visit herehe underwent left mastectomy and sentinel lymph node sampling, 03/08/2015. The final pathology ([id number]) showed an invasive ductal carcinoma, grade 1, measuring 9 mm, with ample margins. Repeat HER-2 was again negative. An Oncotype score of 9 predicted a 7% risk of distant recurrence within 10 years if the only systemic treatment was tamoxifen for 5 years. It also predicts no benefit from chemotherapy. Finally he had genetic testing which shows no deleterious mutation. He is here to discuss antiestrogen  therapy  REVIEW OF SYSTEMS: Riley Murray did well with the surgery, with no significant pain, fever, bleeding, or other complications. He does have some soreness still in the left axillary region, but no swelling or erythema.He is already pretty much back to his normal functional status and they are moving to [place] this week. A detailed review of systems today was otherwise stable  PAST MEDICAL HISTORY: Past Medical History  Diagnosis Date  . Male breast cancer 03/08/15  . Breast cancer 02/24/15    left breast    PAST SURGICAL HISTORY: Past Surgical History  Procedure Laterality Date  . Lumbar disc removal  1991  . Colonoscopy    . Mastectomy w/ sentinel node biopsy Left 03/08/2015    Procedure: LEFT MASTECTOMY WITH LEFT AXILLARY SENTINEL LYMPH NODE BIOPSY;  Surgeon: Alphonsa Overall, MD;  Location: [facility];  Service: General;  Laterality: Left;    FAMILY HISTORY Family History  Problem Relation Age of Onset  . Esophageal cancer Neg Hx   . Rectal cancer Neg Hx   . Stomach cancer Neg Hx   . Stroke Mother   . Diabetes Mother   . Lung cancer Father 4   the patient's father died from lung cancer the age of 85. He had been a smoker. The patient's mother died at the age of 56 following a stroke in the setting of type 1 diabetes. The patient had one brother, one sister. There is no history of breast or ovarian cancer in the family.  SOCIAL HISTORY: Riley Murray works in [place]. His wife [name] works in Psychologist, counselling for the [employer]. They are planning to move  to [place] in the near future. Daughter Claris Pong lives in [place], daughter Fransisco Beau in [place]. There are no grandchildren so far   ADVANCED DIRECTIVES: In place   HEALTH MAINTENANCE: History  Substance Use Topics  . Smoking status: Never Smoker   . Smokeless tobacco: Never Used  . Alcohol Use: No     Colonoscopy: 2015/David Patterson  PSA:  Bone density:  Lipid  panel:  No Known Allergies  Current Outpatient Prescriptions  Medication Sig Dispense Refill  . ALPRAZolam (XANAX) 0.25 MG tablet Take 0.25 mg by mouth at bedtime as needed.    . cholecalciferol (VITAMIN D) 1000 UNITS tablet Take 1,000 Units by mouth daily.    . citalopram (CELEXA) 10 MG tablet Take 10 mg by mouth daily.    . fish oil-omega-3 fatty acids 1000 MG capsule Take 1 g by mouth daily.    . Flaxseed, Linseed, OIL Take 1,200 mg by mouth.    . Garlic Oil 3559 MG CAPS Take 1 capsule by mouth.    Marland Kitchen HYDROcodone-acetaminophen (NORCO/VICODIN) 5-325 MG per tablet Take 1-2 tablets by mouth every 6 (six) hours as needed. (Patient not taking: Reported on 03/22/2015) 30 tablet 0  . ibuprofen (ADVIL,MOTRIN) 200 MG tablet Take 200 mg by mouth every 6 (six) hours as needed.    . MULTIPLE VITAMIN PO Take 1 tablet by mouth.    . Red Yeast Rice 600 MG CAPS Take 1 capsule by mouth.    . vitamin C (ASCORBIC ACID) 500 MG tablet Take 500 mg by mouth daily.     No current facility-administered medications for this visit.   Facility-Administered Medications Ordered in Other Visits  Medication Dose Route Frequency Provider Last Rate Last Dose  . chlorhexidine (HIBICLENS) 4 % liquid 1 application  1 application Topical Once Alphonsa Overall, MD      . chlorhexidine (HIBICLENS) 4 % liquid 1 application  1 application Topical Once Alphonsa Overall, MD        OBJECTIVE: Middle-aged white male  Filed Vitals:   03/29/15 1348  BP: 123/63  Pulse: 70  Temp: 98.1 F (36.7 C)  Resp: 18     Body mass index is 24.75 kg/(m^2).    ECOG FS:0 - Asymptomatic  Sclerae unicteric, EOMs intact Oropharynx clear, dentition in good repair No cervical or supraclavicular adenopathy Lungs no rales or rhonchi Heart regular rate and rhythm Abd soft, nontender, positive bowel sounds MSK no focal spinal tenderness, no upper extremity lymphedema Neuro: nonfocal, well oriented, appropriate affect Breasts: the right breast is  unremarkable per the left breast is status post mastectomy. The incisions are healing nicely, with no dehiscence, swelling, or erythema. The left breast is unremarkable   LAB RESULTS:  CMP     Component Value Date/Time   NA 132* 03/04/2015 1210   NA 134* 03/17/2009 1530   K 4.5 03/04/2015 1210   K 3.9 03/17/2009 1530   CL 93* 03/17/2009 1530   CO2 22 03/04/2015 1210   CO2 31 03/17/2009 1530   GLUCOSE 69* 03/04/2015 1210   GLUCOSE 116* 03/17/2009 1530   BUN 11.7 03/04/2015 1210   BUN 9 03/17/2009 1530   CREATININE 0.9 03/04/2015 1210   CREATININE 0.98 03/17/2009 1530   CALCIUM 9.1 03/04/2015 1210   CALCIUM 9.5 03/17/2009 1530   PROT 7.0 03/04/2015 1210   ALBUMIN 4.0 03/04/2015 1210   AST 21 03/04/2015 1210   ALT 18 03/04/2015 1210   ALKPHOS 60 03/04/2015 1210   BILITOT 0.70 03/04/2015  1210   GFRNONAA >60 03/17/2009 1530   GFRAA  03/17/2009 1530    >60        The eGFR has been calculated using the MDRD equation. This calculation has not been validated in all clinical situations. eGFR's persistently <60 mL/min signify possible Chronic Kidney Disease.    INo results found for: SPEP, UPEP  Lab Results  Component Value Date   WBC 5.9 03/04/2015   NEUTROABS 4.1 03/04/2015   HGB 13.9 03/08/2015   HCT 41.0 03/04/2015   MCV 95.9 03/04/2015   PLT 218 03/04/2015      Chemistry      Component Value Date/Time   NA 132* 03/04/2015 1210   NA 134* 03/17/2009 1530   K 4.5 03/04/2015 1210   K 3.9 03/17/2009 1530   CL 93* 03/17/2009 1530   CO2 22 03/04/2015 1210   CO2 31 03/17/2009 1530   BUN 11.7 03/04/2015 1210   BUN 9 03/17/2009 1530   CREATININE 0.9 03/04/2015 1210   CREATININE 0.98 03/17/2009 1530      Component Value Date/Time   CALCIUM 9.1 03/04/2015 1210   CALCIUM 9.5 03/17/2009 1530   ALKPHOS 60 03/04/2015 1210   AST 21 03/04/2015 1210   ALT 18 03/04/2015 1210   BILITOT 0.70 03/04/2015 1210       No results found for: LABCA2  No components found  for: LABCA125  No results for input(s): INR in the last 168 hours.  Urinalysis No results found for: COLORURINE, APPEARANCEUR, LABSPEC, PHURINE, GLUCOSEU, HGBUR, BILIRUBINUR, KETONESUR, PROTEINUR, UROBILINOGEN, NITRITE, LEUKOCYTESUR  STUDIES: Nm Sentinel Node Inj-no Rpt (breast)  03/08/2015   CLINICAL DATA: left breast cancer   Sulfur colloid was injected intradermally by the nuclear medicine  technologist for breast cancer sentinel node localization.     ASSESSMENT: 64 y.o. [place] man status post left breast biopsy 02/23/2015 for a clinical  T1c N0, stage IA invasive ductal carcinoma, grade 1, prognostic panel pending   (1) left mastectomy and sentinel lymph node sampling 03/08/2015 showed a pT1b pN0, stage IA invasive ductal carcinoma, grade 1, with ample margins. Repeat HER-2 testingwas again negative  (2) genetics testing sent 03/02/2015, results through the Breast High/Moderate Risk gene panel offered by Gene[facility]xshowed no deleterious mutations in ATM, BRCA1, BRCA2, CDH1, CHEK2, PALB2, PTEN, STK11, and TP53.  (3) Oncotype DX Score of 9 predicts a risk of recurrence outside the breast within 10 years of 7% if the patient's only systemic therapy is tamoxifen for 5 years. It also predicts no benefit from chemotherapy.  (4) the patient does not need further local treatment with radiation therapy  (5) tamoxifen started 03/30/2015  PLAN: I reviewed Riley Murray's surgical and molecular biology results with him today. He understands she has a very favorable tumor caught at an early stage. I would think if he did not take any systemic treatment is risk of systemic recurrence would be in the 15% range or so. He can cut this in half by taking tamoxifen for 5 years and that is what is recommended. We then discussed the possible side effects, toxicities and complications of tamoxifen in men. These are poorly reported in the literature, but we did discuss the possibility of blood clots and change in  libido. In general only approximately 5% of men or less withdrawal of tamoxifen therapy because of toxicity (Andrology 03/04/2015)  Accordingly he is starting tamoxifen now. He is planning to maintain his medical care in [place] so he will return to see me in 3  months. If all is going well he will see his surgeon 3 months after that and we will continue to "act team him in that fashion for the first 2 years after which we would broaden the follow-up interval.  Although in some studies follow-up with mammography is important n mann, i think it would be prudent to obtain yearly mammography on the right side and we will arrange for that afterthe next visit here.    Riley Murray has a good understanding of the overall plan. He agrees with it. He knows the goal of treatment in her case is cure. He will call with any problems that may develop before his next visit here.   Chauncey Cruel, MD   03/29/2015 1:53 PM Medical Oncology and Hematology [facility] [address] [place], [place] [address] Tel. [phone]    Fax. [phone]

## 2015-03-29 NOTE — Telephone Encounter (Signed)
Pt confirmed labs/ov per 05/31 POF, gave pt AVS and Calendar..... KJ °

## 2015-03-29 NOTE — Progress Notes (Signed)
Met with patient at his post op appointment with Dr. Jana Hakim.  Discussed navigation resources. Contact information given.  Encouraged him to call with any needs or concerns.

## 2015-05-19 ENCOUNTER — Other Ambulatory Visit: Payer: Self-pay | Admitting: Oncology

## 2015-05-19 ENCOUNTER — Telehealth: Payer: Self-pay | Admitting: *Deleted

## 2015-05-19 NOTE — Telephone Encounter (Signed)
Doubt that tamoxifen is cause, but possible. It is not going to be metastases to the brain-- his cancer was very early stage.  Suggest stop TAM 7 days-- if h/a's persist he should see PCP. Call us after PCP visit to decide on restarting TAM  Thanks!

## 2015-05-19 NOTE — Telephone Encounter (Signed)
This RN returned call to pt and obtained an identified VM- message left informing pt of MD recommendation to hold the tamoxifen for 7 days and monitor symptoms.  Requested for pt to call with update on symptoms next week to this RN or if further concerns to call before then.  This RN's name and office return call number left on VM.

## 2015-05-19 NOTE — Telephone Encounter (Signed)
VM message received from pt @ 10:59 am. Return call to patient. He states he has been on Tamoxifen (not listed in med list) since the first part of June.  In the last week or so he has developed ongoing dull headaches, fatigue, dizzyness.  These are new symptoms. He states they are not debilitating but he was wondering what he should do at this point.

## 2015-05-26 ENCOUNTER — Telehealth: Payer: Self-pay | Admitting: *Deleted

## 2015-05-26 NOTE — Telephone Encounter (Signed)
This RN spoke with pt per his call regarding symptoms of headache and severe fatigue with tamoxifen.  Riley Murray held the medication x 1 week per MD recommendation- he states both symptoms have abated.  Informed Riley Murray to continue to hold the medication and this note will be given to MD upon return to the office on 8/1 for further recommendations.  Riley Murray verbalized understanding.

## 2015-05-26 NOTE — Telephone Encounter (Signed)
PT. CALLED THIS MORNING TO REPORT THAT AFTER HE STOPPED THE TAMOXIFEN FOR SEVEN DAYS THE HEADACHE AND FATIGUE SYMPTOMS HAVE DISAPPEARED.

## 2015-05-29 ENCOUNTER — Other Ambulatory Visit: Payer: Self-pay | Admitting: Oncology

## 2015-05-30 ENCOUNTER — Other Ambulatory Visit: Payer: Self-pay | Admitting: Oncology

## 2015-05-30 NOTE — Progress Notes (Unsigned)
Riley Murray started tamoxifen in June and did fine with it for about 6 weeks then developed fatigue and a headache. He stopped the tamoxifen in a week later the symptoms had stopped. He now feels back to normal. Possibly this was tamoxifen but possibly it was not. I have asked him to restart the tamoxifen now. He is seeing me late August and we will get up at that point.

## 2015-06-28 ENCOUNTER — Other Ambulatory Visit: Payer: Self-pay | Admitting: *Deleted

## 2015-06-28 DIAGNOSIS — C50222 Malignant neoplasm of upper-inner quadrant of left male breast: Secondary | ICD-10-CM

## 2015-06-29 ENCOUNTER — Ambulatory Visit (HOSPITAL_BASED_OUTPATIENT_CLINIC_OR_DEPARTMENT_OTHER): Payer: 59 | Admitting: Oncology

## 2015-06-29 ENCOUNTER — Other Ambulatory Visit (HOSPITAL_BASED_OUTPATIENT_CLINIC_OR_DEPARTMENT_OTHER): Payer: 59

## 2015-06-29 ENCOUNTER — Telehealth: Payer: Self-pay | Admitting: Oncology

## 2015-06-29 VITALS — BP 131/74 | HR 64 | Temp 98.2°F | Resp 18 | Ht 70.0 in | Wt 169.8 lb

## 2015-06-29 DIAGNOSIS — C50222 Malignant neoplasm of upper-inner quadrant of left male breast: Secondary | ICD-10-CM

## 2015-06-29 LAB — COMPREHENSIVE METABOLIC PANEL (CC13)
ALBUMIN: 3.7 g/dL (ref 3.5–5.0)
ALT: 16 U/L (ref 0–55)
AST: 24 U/L (ref 5–34)
Alkaline Phosphatase: 42 U/L (ref 40–150)
Anion Gap: 5 mEq/L (ref 3–11)
BILIRUBIN TOTAL: 0.56 mg/dL (ref 0.20–1.20)
BUN: 14.3 mg/dL (ref 7.0–26.0)
CO2: 28 mEq/L (ref 22–29)
CREATININE: 0.9 mg/dL (ref 0.7–1.3)
Calcium: 8.8 mg/dL (ref 8.4–10.4)
Chloride: 98 mEq/L (ref 98–109)
GLUCOSE: 97 mg/dL (ref 70–140)
Potassium: 4.3 mEq/L (ref 3.5–5.1)
SODIUM: 131 meq/L — AB (ref 136–145)
Total Protein: 6.4 g/dL (ref 6.4–8.3)

## 2015-06-29 LAB — CBC WITH DIFFERENTIAL/PLATELET
BASO%: 1.1 % (ref 0.0–2.0)
Basophils Absolute: 0.1 10*3/uL (ref 0.0–0.1)
EOS ABS: 0.1 10*3/uL (ref 0.0–0.5)
EOS%: 1.9 % (ref 0.0–7.0)
HCT: 39.6 % (ref 38.4–49.9)
HGB: 13.4 g/dL (ref 13.0–17.1)
LYMPH%: 20 % (ref 14.0–49.0)
MCH: 32.4 pg (ref 27.2–33.4)
MCHC: 33.7 g/dL (ref 32.0–36.0)
MCV: 96.1 fL (ref 79.3–98.0)
MONO#: 0.4 10*3/uL (ref 0.1–0.9)
MONO%: 8.2 % (ref 0.0–14.0)
NEUT#: 3.4 10*3/uL (ref 1.5–6.5)
NEUT%: 68.8 % (ref 39.0–75.0)
Platelets: 198 10*3/uL (ref 140–400)
RBC: 4.12 10*6/uL — ABNORMAL LOW (ref 4.20–5.82)
RDW: 13.1 % (ref 11.0–14.6)
WBC: 5 10*3/uL (ref 4.0–10.3)
lymph#: 1 10*3/uL (ref 0.9–3.3)

## 2015-06-29 MED ORDER — TAMOXIFEN CITRATE 20 MG PO TABS: 20.0000 mg | ORAL_TABLET | Freq: Every day | ORAL | Status: AC

## 2015-06-29 NOTE — Telephone Encounter (Signed)
Gave avs & calendar for May. [facility] schedule for 03/01/16 @ 9:00

## 2015-06-29 NOTE — Progress Notes (Signed)
Riley Murray  Telephone:(336) [phone] Fax:(336) [phone]     ID: Riley Murray DOB: February 10, 1951  MR#: [id number]  RCB#:[id number]  Patient Care Team: Riley Kohl, MD as PCP - General (Family Medicine) Riley Cruel, MD as Consulting Physician (Oncology) PCP: Riley Kohl, MD SU: Riley Overall MD OTHER MD:  CHIEF COMPLAINT: Left-sided breast cancer  CURRENT TREATMENT: tamoxifen   BREAST CANCER HISTORY: From the original intake note:  Riley Murray was taking a shower mid April 2016 when he noted a lump in his left breast. He brought it to his wife's attention (she is a former patient of mine with her own history of breast cancer), and then to his physician who set him up for bilateral diagnostic mammography with tomosynthesis and [facility] April 20 05/18/2015. Mammography showed a breast density category A. In the upper inner quadrant of the left breast there was an irregular mass measuring 1.1 cm. This was palpable and mobile. By ultrasound there was an irregularly marginated mass at this location in the left breast measuring 1.1 cm. The left axilla appeared normal sonographically.  Biopsy of the left breast mass in question 02/24/2015 showed (SAA 16-07/02/2007) an invasive ductal carcinoma, grade 1, with a prognostic panel still pending.  The patient's subsequent history is as detailed below.  INTERVAL HISTORY: Riley Murray returns today for follow-up of his breast cancer and specifically to assess how he is tolerating tamoxifen.--He did well initially, but then developed persistent headaches. We suggested he discontinue tamoxifen for a week which she did. The headaches became better. With this suggested he resume the drug to see if the headaches recurred. His wife made that why's suggestion that he should take the drug at bedtime. He did, and has had no further problems with the headaches   REVIEW OF SYSTEMS: Riley Murray reports no  libido problems related to the tamoxifen. He thinks perhaps it makes him a little tired. He does go to the gym every other day or so. He is normally active in all his activities and of course very busy with his work. A detailed review of systems today was otherwise entirely negative  PAST MEDICAL HISTORY: Past Medical History  Diagnosis Date  . Male breast cancer 03/08/15  . Breast cancer 02/24/15    left breast    PAST SURGICAL HISTORY: Past Surgical History  Procedure Laterality Date  . Lumbar disc removal  1991  . Colonoscopy    . Mastectomy w/ sentinel node biopsy Left 03/08/2015    Procedure: LEFT MASTECTOMY WITH LEFT AXILLARY SENTINEL LYMPH NODE BIOPSY;  Surgeon: Riley Overall, MD;  Location: [facility];  Service: General;  Laterality: Left;    FAMILY HISTORY Family History  Problem Relation Age of Onset  . Esophageal cancer Neg Hx   . Rectal cancer Neg Hx   . Stomach cancer Neg Hx   . Stroke Mother   . Diabetes Mother   . Lung cancer Father 31   the patient's father died from lung cancer the age of 16. He had been a smoker. The patient's mother died at the age of 69 following a stroke in the setting of type 1 diabetes. The patient had one brother, one sister. There is no history of breast or ovarian cancer in the family.  SOCIAL HISTORY: Riley Murray works in [employer]. His wife Riley Murray works in Psychologist, counselling for the [employer]. They are planning to move to [place] in the near future. Daughter  Riley Murray lives in [place], daughter Riley Murray in [place]. There are no grandchildren so far   ADVANCED DIRECTIVES: In place   HEALTH MAINTENANCE: Social History  Substance Use Topics  . Smoking status: Never Smoker   . Smokeless tobacco: Never Used  . Alcohol Use: No     Colonoscopy: 2015/Riley Murray  PSA:  Bone density:  Lipid panel:  No Known Allergies  Current Outpatient Prescriptions  Medication Sig Dispense  Refill  . ALPRAZolam (XANAX) 0.25 MG tablet Take 0.25 mg by mouth at bedtime as needed.    . cholecalciferol (VITAMIN D) 1000 UNITS tablet Take 1,000 Units by mouth daily.    . citalopram (CELEXA) 10 MG tablet Take 10 mg by mouth daily.    . fish oil-omega-3 fatty acids 1000 MG capsule Take 1 g by mouth daily.    . Flaxseed, Linseed, OIL Take 1,200 mg by mouth.    . Garlic Oil 3614 MG CAPS Take 1 capsule by mouth.    Marland Kitchen HYDROcodone-acetaminophen (NORCO/VICODIN) 5-325 MG per tablet Take 1-2 tablets by mouth every 6 (six) hours as needed. (Patient not taking: Reported on 03/22/2015) 30 tablet 0  . ibuprofen (ADVIL,MOTRIN) 200 MG tablet Take 200 mg by mouth every 6 (six) hours as needed.    . MULTIPLE VITAMIN PO Take 1 tablet by mouth.    . Red Yeast Rice 600 MG CAPS Take 1 capsule by mouth.    . vitamin C (ASCORBIC ACID) 500 MG tablet Take 500 mg by mouth daily.     No current facility-administered medications for this visit.   Facility-Administered Medications Ordered in Other Visits  Medication Dose Route Frequency Provider Last Rate Last Dose  . chlorhexidine (HIBICLENS) 4 % liquid 1 application  1 application Topical Once Riley Overall, MD      . chlorhexidine (HIBICLENS) 4 % liquid 1 application  1 application Topical Once Riley Overall, MD        OBJECTIVE: Middle-aged white male who appears well Filed Vitals:   06/29/15 1203  BP: 131/74  Pulse: 64  Temp: 98.2 F (36.8 C)  Resp: 18     Body mass index is 24.36 kg/(m^2).    ECOG FS:0 - Asymptomatic  Sclerae unicteric, pupils round and equal Oropharynx clear and moist-- no thrush or other lesions No cervical or supraclavicular adenopathy Lungs no rales or rhonchi Heart regular rate and rhythm Abd soft, nontender, positive bowel sounds MSK no focal spinal tenderness, no upper extremity lymphedema Neuro: nonfocal, well oriented, appropriate affect Breasts: The right breast is unremarkable. There is no gynecomastia. The left breast  status post mastectomy. There is no evidence of chest wall recurrence. The left axilla is benign.   LAB RESULTS:  CMP     Component Value Date/Time   NA 131* 06/29/2015 1144   NA 134* 03/17/2009 1530   K 4.3 06/29/2015 1144   K 3.9 03/17/2009 1530   CL 93* 03/17/2009 1530   CO2 28 06/29/2015 1144   CO2 31 03/17/2009 1530   GLUCOSE 97 06/29/2015 1144   GLUCOSE 116* 03/17/2009 1530   BUN 14.3 06/29/2015 1144   BUN 9 03/17/2009 1530   CREATININE 0.9 06/29/2015 1144   CREATININE 0.98 03/17/2009 1530   CALCIUM 8.8 06/29/2015 1144   CALCIUM 9.5 03/17/2009 1530   PROT 6.4 06/29/2015 1144   ALBUMIN 3.7 06/29/2015 1144   AST 24 06/29/2015 1144   ALT 16 06/29/2015 1144   ALKPHOS 42 06/29/2015 1144   BILITOT 0.56 06/29/2015  [place] 03/17/2009 1530   GFRAA  03/17/2009 1530    >60        The eGFR has been calculated using the MDRD equation. This calculation has not been validated in all clinical situations. eGFR's persistently <60 mL/min signify possible Chronic Kidney Disease.    INo results found for: SPEP, UPEP  Lab Results  Component Value Date   WBC 5.0 06/29/2015   NEUTROABS 3.4 06/29/2015   HGB 13.4 06/29/2015   HCT 39.6 06/29/2015   MCV 96.1 06/29/2015   PLT 198 06/29/2015      Chemistry      Component Value Date/Time   NA 131* 06/29/2015 1144   NA 134* 03/17/2009 1530   K 4.3 06/29/2015 1144   K 3.9 03/17/2009 1530   CL 93* 03/17/2009 1530   CO2 28 06/29/2015 1144   CO2 31 03/17/2009 1530   BUN 14.3 06/29/2015 1144   BUN 9 03/17/2009 1530   CREATININE 0.9 06/29/2015 1144   CREATININE 0.98 03/17/2009 1530      Component Value Date/Time   CALCIUM 8.8 06/29/2015 1144   CALCIUM 9.5 03/17/2009 1530   ALKPHOS 42 06/29/2015 1144   AST 24 06/29/2015 1144   ALT 16 06/29/2015 1144   BILITOT 0.56 06/29/2015 1144       No results found for: LABCA2  No components found for: LABCA125  No results for input(s): INR in the last 168  hours.  Urinalysis No results found for: COLORURINE, APPEARANCEUR, LABSPEC, PHURINE, GLUCOSEU, HGBUR, BILIRUBINUR, KETONESUR, PROTEINUR, UROBILINOGEN, NITRITE, LEUKOCYTESUR  STUDIES: No results found.  ASSESSMENT: 64 y.o. [place] man status post left breast biopsy 02/23/2015 for a clinical  T1c N0, stage IA invasive ductal carcinoma, grade 1, prognostic panel pending   (1) left mastectomy and sentinel lymph node sampling 03/08/2015 showed a pT1b pN0, stage IA invasive ductal carcinoma, grade 1, with ample margins. Repeat HER-2 testingwas again negative  (2) genetics testing sent 03/02/2015, results through the Breast High/Moderate Risk gene panel offered by GeneDxshowed no deleterious mutations in ATM, BRCA1, BRCA2, CDH1, CHEK2, PALB2, PTEN, STK11, and TP53.  (3) Oncotype DX Score of 9 predicts a risk of recurrence outside the breast within 10 years of 7% if the patient's only systemic therapy is tamoxifen for 5 years. It also predicts no benefit from chemotherapy.  (4) the patient does not need further local treatment with radiation therapy  (5) tamoxifen started 03/30/2015  PLAN: Riley Murray is now tolerating tamoxifen with no major side effects. The plan will be to continue that a minimum of 5 years.Marland Kitchen He is up-to-date on his health maintenance and is exercising regularly.  We discussed mammography in men, especially in men like him who don't have significant gynecomastia. Though the benefit may be marginal we decided to go ahead with it and that will be scheduled for early May. If he has the mammogram in the morning he can see me that same afternoon and save himself an extra trip to [place].  He requested a prosthesis order for Riley Murray which we were glad to provide. He knows to call for any problems that may develop before his next visit here.   Riley Cruel, MD   06/29/2015 6:16 PM Medical Oncology and Hematology [facility] [address] [place], [place]  [address] Tel. [phone]    Fax. [phone]

## 2015-06-30 ENCOUNTER — Other Ambulatory Visit: Payer: Self-pay | Admitting: *Deleted

## 2015-06-30 ENCOUNTER — Telehealth: Payer: Self-pay | Admitting: Oncology

## 2015-06-30 DIAGNOSIS — C50222 Malignant neoplasm of upper-inner quadrant of left male breast: Secondary | ICD-10-CM

## 2015-06-30 NOTE — Telephone Encounter (Signed)
Called and left a message to call for survivorship appointment

## 2015-10-03 ENCOUNTER — Telehealth: Payer: Self-pay | Admitting: *Deleted

## 2015-10-03 ENCOUNTER — Other Ambulatory Visit: Payer: Self-pay | Admitting: Oncology

## 2015-10-03 NOTE — Telephone Encounter (Signed)
Would go off tamoxifen until he is fully ambulatory and they stop the blood thinners. He can resume tamoxifen one week after blood thinners stopped.

## 2015-10-03 NOTE — Telephone Encounter (Signed)
Patient called stating that he recently broke his ankle got a opinion from a friend that he should be on blood thinners. Patient is currently on Tamoxifen and would like to know your opinion on if he should stop this medication if he begins any type of blood thinners? Message sent to MD Magrinat/RN Zigmund Daniel.

## 2015-10-05 ENCOUNTER — Telehealth: Payer: Self-pay | Admitting: *Deleted

## 2015-10-05 NOTE — Telephone Encounter (Signed)
This RN returned call to phone number per demographic page- obtained number identified VM.  Message left to return call regarding concerns related to being on tamoxifen with new broken ankle- and inquiry about blood thinners.  This RN's name and return call number given.

## 2015-10-05 NOTE — Telephone Encounter (Signed)
This RN spoke with pt per his return call.  Per discussion pt was seen by his orthopedist this am and was advised he did not need to be on blood thinners.  Discussed Dr Gerarda Fraction advice to hold tamoxifen presently and resume 1 week post fully ambulating ( weight bearing ).  Questions answered per his inquiry and no further concerns at this time.

## 2015-10-11 NOTE — Telephone Encounter (Signed)
I don't know pt ever got the message-- can you f/u?  Thanks

## 2015-12-07 ENCOUNTER — Encounter: Payer: Self-pay | Admitting: Gastroenterology

## 2016-03-01 ENCOUNTER — Telehealth: Payer: Self-pay | Admitting: Oncology

## 2016-03-01 ENCOUNTER — Other Ambulatory Visit: Payer: Self-pay | Admitting: *Deleted

## 2016-03-01 ENCOUNTER — Other Ambulatory Visit (HOSPITAL_BASED_OUTPATIENT_CLINIC_OR_DEPARTMENT_OTHER): Payer: Managed Care, Other (non HMO)

## 2016-03-01 ENCOUNTER — Ambulatory Visit (HOSPITAL_BASED_OUTPATIENT_CLINIC_OR_DEPARTMENT_OTHER): Payer: Managed Care, Other (non HMO) | Admitting: Oncology

## 2016-03-01 VITALS — BP 136/71 | HR 76 | Temp 98.1°F | Resp 18 | Ht 70.0 in | Wt 169.6 lb

## 2016-03-01 DIAGNOSIS — C50222 Malignant neoplasm of upper-inner quadrant of left male breast: Secondary | ICD-10-CM

## 2016-03-01 DIAGNOSIS — Z170 Estrogen receptor positive status [ER+]: Secondary | ICD-10-CM | POA: Diagnosis not present

## 2016-03-01 LAB — CBC WITH DIFFERENTIAL/PLATELET
BASO%: 0.5 % (ref 0.0–2.0)
Basophils Absolute: 0 10*3/uL (ref 0.0–0.1)
EOS%: 1 % (ref 0.0–7.0)
Eosinophils Absolute: 0.1 10*3/uL (ref 0.0–0.5)
HEMATOCRIT: 39.3 % (ref 38.4–49.9)
HEMOGLOBIN: 13.6 g/dL (ref 13.0–17.1)
LYMPH%: 16.2 % (ref 14.0–49.0)
MCH: 32.9 pg (ref 27.2–33.4)
MCHC: 34.6 g/dL (ref 32.0–36.0)
MCV: 94.9 fL (ref 79.3–98.0)
MONO#: 0.5 10*3/uL (ref 0.1–0.9)
MONO%: 7.7 % (ref 0.0–14.0)
NEUT#: 4.4 10*3/uL (ref 1.5–6.5)
NEUT%: 74.6 % (ref 39.0–75.0)
PLATELETS: 196 10*3/uL (ref 140–400)
RBC: 4.14 10*6/uL — ABNORMAL LOW (ref 4.20–5.82)
RDW: 12.4 % (ref 11.0–14.6)
WBC: 5.9 10*3/uL (ref 4.0–10.3)
lymph#: 1 10*3/uL (ref 0.9–3.3)

## 2016-03-01 LAB — COMPREHENSIVE METABOLIC PANEL
ALBUMIN: 3.7 g/dL (ref 3.5–5.0)
ALK PHOS: 45 U/L (ref 40–150)
ALT: 18 U/L (ref 0–55)
AST: 19 U/L (ref 5–34)
Anion Gap: 5 mEq/L (ref 3–11)
BUN: 13.8 mg/dL (ref 7.0–26.0)
CALCIUM: 8.7 mg/dL (ref 8.4–10.4)
CO2: 28 mEq/L (ref 22–29)
Chloride: 96 mEq/L — ABNORMAL LOW (ref 98–109)
Creatinine: 1 mg/dL (ref 0.7–1.3)
EGFR: 80 mL/min/{1.73_m2} — ABNORMAL LOW (ref >90)
Glucose: 128 mg/dl (ref 70–140)
POTASSIUM: 4.6 meq/L (ref 3.5–5.1)
Sodium: 130 mEq/L — ABNORMAL LOW (ref 136–145)
Total Bilirubin: 0.74 mg/dL (ref 0.20–1.20)
Total Protein: 6.5 g/dL (ref 6.4–8.3)

## 2016-03-01 MED ORDER — TAMOXIFEN CITRATE 20 MG PO TABS: 20.0000 mg | ORAL_TABLET | Freq: Every day | ORAL | Status: AC

## 2016-03-01 NOTE — Telephone Encounter (Signed)
appt made and avs printed °

## 2016-03-01 NOTE — Progress Notes (Signed)
[facility]  Telephone:(336) [phone] Fax:(336) [phone]     ID: Riley Murray DOB: [birth year]  MR#: [id number]  BPZ#:[id number]  Patient Care Team: Evert Kohl, MD as PCP - General (Family Medicine) Chauncey Cruel, MD as Consulting Physician (Oncology) PCP: Evert Kohl, MD SU: Alphonsa Overall MD OTHER MD:  CHIEF COMPLAINT: Left-sided breast cancer  CURRENT TREATMENT: tamoxifen   BREAST CANCER HISTORY: From the original intake note:  Riley Murray was taking a shower mid April 2016 when he noted a lump in his left breast. He brought it to his wife's attention (she is a former patient of mine with her own history of breast cancer), and then to his physician who set him up for bilateral diagnostic mammography with tomosynthesis and left breast ultrasonography [facility] April 20 05/18/2015. Mammography showed a breast density category A. In the upper inner quadrant of the left breast there was an irregular mass measuring 1.1 cm. This was palpable and mobile. By ultrasound there was an irregularly marginated mass at this location in the left breast measuring 1.1 cm. The left axilla appeared normal sonographically.  Biopsy of the left breast mass in question 02/24/2015 showed (SAA 16-07/02/2007) an invasive ductal carcinoma, grade 1, with a prognostic panel still pending.  The patient's subsequent history is as detailed below.  INTERVAL HISTORY: Riley Murray returns today for follow-up of his estrogen receptor positive breast cancer.  He continues on tamoxifen, which she now takes in the evening. That is taking care of the headaches that he had been experiencing before. He has no symptoms related to this drug. He obtains it at practically no cost   REVIEW OF SYSTEMS: Riley Murray fractured an ankle and that has kept him from doing as much cardio [place] would like. He continues to do some weights particularly at the [employer] that they have where he works. Aside from these issues a  detailed review of systems today was totally negative  PAST MEDICAL HISTORY: Past Medical History  Diagnosis Date  . Male breast cancer 03/08/15  . Breast cancer 02/24/15    left breast    PAST SURGICAL HISTORY: Past Surgical History  Procedure Laterality Date  . Lumbar disc removal  1991  . Colonoscopy    . Mastectomy w/ sentinel node biopsy Left 03/08/2015    Procedure: LEFT MASTECTOMY WITH LEFT AXILLARY SENTINEL LYMPH NODE BIOPSY;  Surgeon: Alphonsa Overall, MD;  Location: [facility];  Service: General;  Laterality: Left;    FAMILY HISTORY Family History  Problem Relation Age of Onset  . Esophageal cancer Neg Hx   . Rectal cancer Neg Hx   . Stomach cancer Neg Hx   . Stroke Mother   . Diabetes Mother   . Lung cancer Father 51   the patient's father died from lung cancer the age of 12. He had been a smoker. The patient's mother died at the age of 98 following a stroke in the setting of type 1 diabetes. The patient had one brother, one sister. There is no history of breast or ovarian cancer in the family.  SOCIAL HISTORY: Riley Murray works in [employer]. His wife Riley Murray works in Psychologist, counselling for the [employer]. They are planning to move to [place] in the near future. Daughter Riley Murray lives in [place], daughter Riley Murray in [place]. There are no grandchildren so far   ADVANCED DIRECTIVES: In place   HEALTH MAINTENANCE: Social History  Substance Use Topics  . Smoking status:  Never Smoker   . Smokeless tobacco: Never Used  . Alcohol Use: No     Colonoscopy: 2015/David Patterson  PSA:  Bone density:  Lipid panel:  No Known Allergies  Current Outpatient Prescriptions  Medication Sig Dispense Refill  . ALPRAZolam (XANAX) 0.25 MG tablet Take 0.25 mg by mouth at bedtime as needed.    . cholecalciferol (VITAMIN D) 1000 UNITS tablet Take 1,000 Units by mouth daily.    . citalopram (CELEXA) 10 MG tablet Take 10 mg by mouth  daily.    . fish oil-omega-3 fatty acids 1000 MG capsule Take 1 g by mouth daily.    . Flaxseed, Linseed, OIL Take 1,200 mg by mouth.    . Garlic Oil 6948 MG CAPS Take 1 capsule by mouth.    Marland Kitchen ibuprofen (ADVIL,MOTRIN) 200 MG tablet Take 200 mg by mouth every 6 (six) hours as needed.    . MULTIPLE VITAMIN PO Take 1 tablet by mouth.    . Red Yeast Rice 600 MG CAPS Take 1 capsule by mouth.    . vitamin C (ASCORBIC ACID) 500 MG tablet Take 500 mg by mouth daily.     No current facility-administered medications for this visit.   Facility-Administered Medications Ordered in Other Visits  Medication Dose Route Frequency Provider Last Rate Last Dose  . chlorhexidine (HIBICLENS) 4 % liquid 1 application  1 application Topical Once Alphonsa Overall, MD      . chlorhexidine (HIBICLENS) 4 % liquid 1 application  1 application Topical Once Alphonsa Overall, MD        OBJECTIVE: Middle-aged white man  In no acute distress  Filed Vitals:   03/01/16 1145  BP: 136/71  Pulse: 76  Temp: 98.1 F (36.7 C)  Resp: 18     Body mass index is 24.34 kg/(m^2).    ECOG FS:0 - Asymptomatic  Sclerae unicteric, EOMs intact Oropharynx clear, dentition in good repair No cervical or supraclavicular adenopathy Lungs no rales or rhonchi Heart regular rate and rhythm Abd soft, nontender, positive bowel sounds MSK no focal spinal tenderness, no upper extremity lymphedema Neuro: nonfocal, well oriented, appropriate affect Breasts:  The right breast is unremarkable. The left breast is status post mastectomy. There is no evidence of local recurrence. Left axilla is benign.   LAB RESULTS:  CMP     Component Value Date/Time   NA 131* 06/29/2015 1144   NA 134* 03/17/2009 1530   K 4.3 06/29/2015 1144   K 3.9 03/17/2009 1530   CL 93* 03/17/2009 1530   CO2 28 06/29/2015 1144   CO2 31 03/17/2009 1530   GLUCOSE 97 06/29/2015 1144   GLUCOSE 116* 03/17/2009 1530   BUN 14.3 06/29/2015 1144   BUN 9 03/17/2009 1530    CREATININE 0.9 06/29/2015 1144   CREATININE 0.98 03/17/2009 1530   CALCIUM 8.8 06/29/2015 1144   CALCIUM 9.5 03/17/2009 1530   PROT 6.4 06/29/2015 1144   ALBUMIN 3.7 06/29/2015 1144   AST 24 06/29/2015 1144   ALT 16 06/29/2015 1144   ALKPHOS 42 06/29/2015 1144   BILITOT 0.56 06/29/2015 1144   GFRNONAA >60 03/17/2009 1530   GFRAA  03/17/2009 1530    >60        The eGFR has been calculated using the MDRD equation. This calculation has not been validated in all clinical situations. eGFR's persistently <60 mL/min signify possible Chronic Kidney Disease.    INo results found for: SPEP, UPEP  Lab Results  Component Value Date  WBC 5.9 03/01/2016   NEUTROABS 4.4 03/01/2016   HGB 13.6 03/01/2016   HCT 39.3 03/01/2016   MCV 94.9 03/01/2016   PLT 196 03/01/2016      Chemistry      Component Value Date/Time   NA 131* 06/29/2015 1144   NA 134* 03/17/2009 1530   K 4.3 06/29/2015 1144   K 3.9 03/17/2009 1530   CL 93* 03/17/2009 1530   CO2 28 06/29/2015 1144   CO2 31 03/17/2009 1530   BUN 14.3 06/29/2015 1144   BUN 9 03/17/2009 1530   CREATININE 0.9 06/29/2015 1144   CREATININE 0.98 03/17/2009 1530      Component Value Date/Time   CALCIUM 8.8 06/29/2015 1144   CALCIUM 9.5 03/17/2009 1530   ALKPHOS 42 06/29/2015 1144   AST 24 06/29/2015 1144   ALT 16 06/29/2015 1144   BILITOT 0.56 06/29/2015 1144       No results found for: LABCA2  No components found for: LABCA125  No results for input(s): INR in the last 168 hours.  Urinalysis No results found for: COLORURINE, APPEARANCEUR, LABSPEC, PHURINE, GLUCOSEU, HGBUR, BILIRUBINUR, KETONESUR, PROTEINUR, UROBILINOGEN, NITRITE, LEUKOCYTESUR  STUDIES:  mammography earlier today was unremarkable  ASSESSMENT: 65 y.o. [place] man status post left breast biopsy 02/23/2015 for a clinical  T1c N0, stage IA invasive ductal carcinoma, grade 1, prognostic panel pending   (1) left mastectomy and sentinel lymph node sampling  03/08/2015 showed a pT1b pN0, stage IA invasive ductal carcinoma, grade 1, with ample margins. Repeat HER-2 testingwas again negative  (2) genetics testing sent 03/02/2015, results through the Breast High/Moderate Risk gene panel offered by GeneDxshowed no deleterious mutations in ATM, BRCA1, BRCA2, CDH1, CHEK2, PALB2, PTEN, STK11, and TP53.  (3) Oncotype DX Score of 9 predicts a risk of recurrence outside the breast within 10 years of 7% if the patient's only systemic therapy is tamoxifen for 5 years. It also predicts no benefit from chemotherapy.  (4) the patient did not need further local treatment with radiation therapy  (5) tamoxifen started 03/30/2015  PLAN: Riley Murray  Is now a year out from his definitive surgery for breast cancer with no evidence of disease recurrence. This is very favorable.  He is tolerating tamoxifen with no side effects whatsoever. He is obtaining it at a very good price.  We had a long discussion regarding restaging studies there these are not recommended. They have not been shown to prolong survival or 2 had off a recurrence. All they tend to do is expose the person to radiation and false positives.   We do agree that if he develops a symptom that he cannot explain that last more than 04 days he will call and we will be aggressive at working it up.   he also expressed some concerns regarding his 2 daughters, more currently 49 and 17. The 65 year old daughter in particular is planning to have children 7. I reassured him that if anything  Carrying a child to term before the age of 57 reduces the risk of breast cancer. So does nursing. In terms of mammography, I would not recommend mammography before 35 at the earliest. 40 would be a reasonable age on which to start.   Otherwise I will see him again in one year. He will have his mammogram again on the same day. He knows to call for any other problems that may develop before then.   Chauncey Cruel, MD   03/01/2016  11:51 AM Medical Oncology and Hematology [facility]  [place], [place] [address] Tel. [phone]    Fax. [phone]

## 2016-04-24 ENCOUNTER — Encounter: Payer: Self-pay | Admitting: Oncology

## 2017-02-04 ENCOUNTER — Telehealth: Payer: Self-pay | Admitting: *Deleted

## 2017-02-04 ENCOUNTER — Other Ambulatory Visit: Payer: Self-pay | Admitting: *Deleted

## 2017-02-04 DIAGNOSIS — C50222 Malignant neoplasm of upper-inner quadrant of left male breast: Secondary | ICD-10-CM

## 2017-02-04 DIAGNOSIS — Z170 Estrogen receptor positive status [ER+]: Secondary | ICD-10-CM

## 2017-02-04 NOTE — Telephone Encounter (Signed)
This RN returned call to pt per his VM inquiring about need to schedule yearly mammo prior to MD visit.  Order placed for [facility] and faxed - In Box request sent to scheduling due to external facility.

## 2017-02-06 ENCOUNTER — Telehealth: Payer: Self-pay | Admitting: Oncology

## 2017-02-06 NOTE — Telephone Encounter (Signed)
Called [facility] to sch mammogram appt per LOS. Pt already has appt scheduled for 5/8 at [facility]. Orders faxed on 4/11

## 2017-03-12 ENCOUNTER — Ambulatory Visit (HOSPITAL_BASED_OUTPATIENT_CLINIC_OR_DEPARTMENT_OTHER): Payer: 59 | Admitting: Oncology

## 2017-03-12 ENCOUNTER — Other Ambulatory Visit (HOSPITAL_BASED_OUTPATIENT_CLINIC_OR_DEPARTMENT_OTHER): Payer: 59

## 2017-03-12 VITALS — BP 133/77 | HR 65 | Temp 97.9°F | Resp 20 | Ht 70.0 in | Wt 174.9 lb

## 2017-03-12 DIAGNOSIS — C50212 Malignant neoplasm of upper-inner quadrant of left female breast: Secondary | ICD-10-CM

## 2017-03-12 DIAGNOSIS — C50222 Malignant neoplasm of upper-inner quadrant of left male breast: Secondary | ICD-10-CM | POA: Diagnosis not present

## 2017-03-12 DIAGNOSIS — Z79810 Long term (current) use of selective estrogen receptor modulators (SERMs): Secondary | ICD-10-CM | POA: Diagnosis not present

## 2017-03-12 DIAGNOSIS — Z170 Estrogen receptor positive status [ER+]: Secondary | ICD-10-CM

## 2017-03-12 LAB — COMPREHENSIVE METABOLIC PANEL
ALBUMIN: 3.9 g/dL (ref 3.5–5.0)
ALK PHOS: 52 U/L (ref 40–150)
ALT: 22 U/L (ref 0–55)
AST: 27 U/L (ref 5–34)
Anion Gap: 8 mEq/L (ref 3–11)
BILIRUBIN TOTAL: 0.83 mg/dL (ref 0.20–1.20)
BUN: 11 mg/dL (ref 7.0–26.0)
CALCIUM: 9 mg/dL (ref 8.4–10.4)
CO2: 26 mEq/L (ref 22–29)
Chloride: 97 mEq/L — ABNORMAL LOW (ref 98–109)
Creatinine: 0.9 mg/dL (ref 0.7–1.3)
EGFR: 88 mL/min/{1.73_m2} — ABNORMAL LOW (ref >90)
Glucose: 95 mg/dl (ref 70–140)
POTASSIUM: 4.2 meq/L (ref 3.5–5.1)
Sodium: 131 mEq/L — ABNORMAL LOW (ref 136–145)
Total Protein: 6.7 g/dL (ref 6.4–8.3)

## 2017-03-12 LAB — CBC WITH DIFFERENTIAL/PLATELET
BASO%: 0.8 % (ref 0.0–2.0)
Basophils Absolute: 0.1 10*3/uL (ref 0.0–0.1)
EOS%: 2 % (ref 0.0–7.0)
Eosinophils Absolute: 0.1 10*3/uL (ref 0.0–0.5)
HCT: 39.7 % (ref 38.4–49.9)
HGB: 13.4 g/dL (ref 13.0–17.1)
LYMPH%: 17.2 % (ref 14.0–49.0)
MCH: 32.2 pg (ref 27.2–33.4)
MCHC: 33.8 g/dL (ref 32.0–36.0)
MCV: 95.4 fL (ref 79.3–98.0)
MONO#: 0.6 10*3/uL (ref 0.1–0.9)
MONO%: 9.8 % (ref 0.0–14.0)
NEUT%: 70.2 % (ref 39.0–75.0)
NEUTROS ABS: 4.5 10*3/uL (ref 1.5–6.5)
PLATELETS: 184 10*3/uL (ref 140–400)
RBC: 4.16 10*6/uL — AB (ref 4.20–5.82)
RDW: 12.5 % (ref 11.0–14.6)
WBC: 6.4 10*3/uL (ref 4.0–10.3)
lymph#: 1.1 10*3/uL (ref 0.9–3.3)

## 2017-03-12 MED ORDER — TAMOXIFEN CITRATE 20 MG PO TABS
20.0000 mg | ORAL_TABLET | Freq: Every day | ORAL | 12 refills | Status: AC
Start: 2017-03-12 — End: 2017-04-11

## 2017-03-12 NOTE — Progress Notes (Signed)
[place]  Telephone:(336) [phone] Fax:(336) [phone]     ID: Va Broadwell DOB: 11/30/1950  MR#: [id number]  OEV#:[id number]  Patient Care Team: Evert Kohl, MD as PCP - General (Family Medicine) Cerinity Zynda, Virgie Dad, MD as Consulting Physician (Oncology) PCP: Evert Kohl, MD SU: Alphonsa Overall MD OTHER MD:  CHIEF COMPLAINT: Left-sided breast cancer  CURRENT TREATMENT: tamoxifen   BREAST CANCER HISTORY: From the original intake note:  Riley Murray was taking a shower mid April 2016 when he noted a lump in his left breast. He brought it to his wife's attention (she is a former patient of mine with her own history of breast cancer), and then to his physician who set him up for bilateral diagnostic mammography with tomosynthesis and [facility] April 20 05/18/2015. Mammography showed a breast density category A. In the upper inner quadrant of the left breast there was an irregular mass measuring 1.1 cm. This was palpable and mobile. By ultrasound there was an irregularly marginated mass at this location in the left breast measuring 1.1 cm. The left axilla appeared normal sonographically.  Biopsy of the left breast mass in question 02/24/2015 showed (SAA 16-07/02/2007) an invasive ductal carcinoma, grade 1, with a prognostic panel still pending.  The patient's subsequent history is as detailed below.  INTERVAL HISTORY: Riley Murray returns today for follow-up of his estrogen receptor positive breast cancer. He continues on tamoxifen with good tolerance. Since he started taking the drug at bedtime, he has had no side effects from  He had his wife [name] have moved back to [place]. This makes his visits here much more convenient for him.    REVIEW OF SYSTEMS: Riley Murray goes to the gym 3 times a week and is learning to play tennis. Aside from these issues a detailed review of systems today was stable  PAST MEDICAL HISTORY: Past  Medical History:  Diagnosis Date  . Breast cancer 02/24/15   left breast  . Male breast cancer 03/08/15    PAST SURGICAL HISTORY: Past Surgical History:  Procedure Laterality Date  . COLONOSCOPY    . lumbar disc removal  1991  . MASTECTOMY W/ SENTINEL NODE BIOPSY Left 03/08/2015   Procedure: LEFT MASTECTOMY WITH LEFT AXILLARY SENTINEL LYMPH NODE BIOPSY;  Surgeon: Alphonsa Overall, MD;  Location: [facility];  Service: General;  Laterality: Left;    FAMILY HISTORY Family History  Problem Relation Age of Onset  . Esophageal cancer Neg Hx   . Rectal cancer Neg Hx   . Stomach cancer Neg Hx   . Stroke Mother   . Diabetes Mother   . Lung cancer Father 87   the patient's father died from lung cancer the age of 80. He had been a smoker. The patient's mother died at the age of 73 following a stroke in the setting of type 1 diabetes. The patient had one brother, one sister. There is no history of breast or ovarian cancer in the family.  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 Marland Kitchen Daughter Riley Murray lives in [place], daughter Riley Murray in [place]. There are no grandchildren so far   ADVANCED DIRECTIVES: In place   HEALTH MAINTENANCE: Social History  Substance Use Topics  . Smoking status: Never Smoker  . Smokeless tobacco: Never Used  . Alcohol use No     Colonoscopy: 2015/David Patterson  PSA:  Bone density:  Lipid panel:  No Known Allergies  Current Outpatient Prescriptions  Medication Sig Dispense Refill  . ALPRAZolam (XANAX) 0.25 MG tablet Take 0.25 mg by mouth at bedtime as needed.    . cholecalciferol (VITAMIN D) 1000 UNITS tablet Take 1,000 Units by mouth daily.    . citalopram (CELEXA) 10 MG tablet Take 10 mg by mouth daily.    . fish oil-omega-3 fatty acids 1000 MG capsule Take 1 g by mouth daily.    . Flaxseed, Linseed, OIL Take 1,200 mg by mouth.    . Garlic Oil 2620 MG  CAPS Take 1 capsule by mouth.    Marland Kitchen ibuprofen (ADVIL,MOTRIN) 200 MG tablet Take 200 mg by mouth every 6 (six) hours as needed.    . MULTIPLE VITAMIN PO Take 1 tablet by mouth.    . Red Yeast Rice 600 MG CAPS Take 1 capsule by mouth.    . vitamin C (ASCORBIC ACID) 500 MG tablet Take 500 mg by mouth daily.     No current facility-administered medications for this visit.     OBJECTIVE: Middle-aged white manWho appears well  Vitals:   03/12/17 1117  BP: 133/77  Pulse: 65  Resp: 20  Temp: 97.9 F (36.6 C)     Body mass index is 25.1 kg/m.    ECOG FS:0 - Asymptomatic  Sclerae unicteric, EOMs intact Oropharynx clear and moist No cervical or supraclavicular adenopathy Lungs no rales or rhonchi Heart regular rate and rhythm Abd soft, nontender, positive bowel sounds MSK no focal spinal tenderness, no upper extremity lymphedema Neuro: nonfocal, well oriented, appropriate affect Breasts: The right breast is benign. The left breast is status post mastectomy with no evidence of local recurrence. Both axillae are benign.  LAB RESULTS:  CMP     Component Value Date/Time   NA 131 (L) 03/12/2017 1106   K 4.2 03/12/2017 1106   CL 93 (L) 03/17/2009 1530   CO2 26 03/12/2017 1106   GLUCOSE 95 03/12/2017 1106   BUN 11.0 03/12/2017 1106   CREATININE 0.9 03/12/2017 1106   CALCIUM 9.0 03/12/2017 1106   PROT 6.7 03/12/2017 1106   ALBUMIN 3.9 03/12/2017 1106   AST 27 03/12/2017 1106   ALT 22 03/12/2017 1106   ALKPHOS 52 03/12/2017 1106   BILITOT 0.83 03/12/2017 1106   GFRNONAA >60 03/17/2009 1530   GFRAA  03/17/2009 1530    >60        The eGFR has been calculated using the MDRD equation. This calculation has not been validated in all clinical situations. eGFR's persistently <60 mL/min signify possible Chronic Kidney Disease.    INo results found for: SPEP, UPEP  Lab Results  Component Value Date   WBC 6.4 03/12/2017   NEUTROABS 4.5 03/12/2017   HGB 13.4 03/12/2017   HCT 39.7  03/12/2017   MCV 95.4 03/12/2017   PLT 184 03/12/2017      Chemistry      Component Value Date/Time   NA 131 (L) 03/12/2017 1106   K 4.2 03/12/2017 1106   CL 93 (L) 03/17/2009 1530   CO2 26 03/12/2017 1106   BUN 11.0 03/12/2017 1106   CREATININE 0.9 03/12/2017 1106      Component Value Date/Time   CALCIUM 9.0 03/12/2017 1106   ALKPHOS 52 03/12/2017 1106   AST 27 03/12/2017 1106   ALT 22 03/12/2017 1106   BILITOT 0.83 03/12/2017 1106       No results found for: LABCA2  No components found for: [id number]  No results  for input(s): INR in the last 168 hours.  Urinalysis No results found for: COLORURINE, APPEARANCEUR, LABSPEC, PHURINE, GLUCOSEU, HGBUR, BILIRUBINUR, KETONESUR, PROTEINUR, UROBILINOGEN, NITRITE, LEUKOCYTESUR  STUDIES: Mammography 03/05/2017 at [facility] finds a breast density to be category 8. There is no evidence of malignancy.  ASSESSMENT: 66 y.o. [place] man status post left breast biopsy 02/23/2015 for a clinical  T1c N0, stage IA invasive ductal carcinoma, grade 1, prognostic panel pending   (1) left mastectomy and sentinel lymph node sampling 03/08/2015 showed a pT1b pN0, stage IA invasive ductal carcinoma, grade 1, with ample margins. Repeat HER-2 testingwas again negative  (2) genetics testing sent 03/02/2015, results through the Breast High/Moderate Risk gene panel offered by Gene[facility]xshowed no deleterious mutations in ATM, BRCA1, BRCA2, CDH1, CHEK2, PALB2, PTEN, STK11, and TP53.  (3) Oncotype DX Score of 9 predicts a risk of recurrence outside the breast within 10 years of 7% if the patient's only systemic therapy is tamoxifen for 5 years. It also predicts no benefit from chemotherapy.  (4) the patient did not need further local treatment with radiation therapy  (5) tamoxifen started 03/30/2015  PLAN: Riley Murray is now 2 years out from definitive surgery for his breast cancer with no evidence of disease recurrence. This is very favorable.  He is  tolerating tamoxifen without any particular difficulties, and the plan will be to continue that to a total of 5 years  Now that he is back in [place] we don't have to schedule the mammography and labs on the same day. We will do them about a week before the visit.  He knows to call for any problems that may develop before his return here.     Chauncey Cruel, MD   03/12/2017 11:36 AM Medical Oncology and Hematology [facility] [address] [place], [place] [address] Tel. [phone]    Fax. [phone]

## 2017-03-12 NOTE — Addendum Note (Signed)
Addended by: Chauncey Cruel on: 03/12/2017 11:39 AM   Modules accepted: Orders

## 2017-03-27 ENCOUNTER — Other Ambulatory Visit: Payer: Self-pay | Admitting: Internal Medicine

## 2017-03-27 DIAGNOSIS — R1011 Right upper quadrant pain: Secondary | ICD-10-CM

## 2017-03-27 DIAGNOSIS — R110 Nausea: Secondary | ICD-10-CM

## 2017-04-15 ENCOUNTER — Other Ambulatory Visit: Payer: Self-pay | Admitting: *Deleted

## 2017-04-15 MED ORDER — TAMOXIFEN CITRATE 20 MG PO TABS: 20.0000 mg | ORAL_TABLET | Freq: Every day | ORAL | 3 refills | Status: DC

## 2017-07-31 ENCOUNTER — Telehealth: Payer: Self-pay | Admitting: Oncology

## 2017-07-31 NOTE — Telephone Encounter (Signed)
Spoke with patient and rescheduled appt per their availability.

## 2017-08-27 ENCOUNTER — Telehealth: Payer: Self-pay | Admitting: *Deleted

## 2017-09-26 NOTE — Telephone Encounter (Signed)
No entry 

## 2018-03-02 ENCOUNTER — Other Ambulatory Visit: Payer: Self-pay | Admitting: Oncology

## 2018-03-05 ENCOUNTER — Other Ambulatory Visit: Payer: 59

## 2018-03-12 ENCOUNTER — Ambulatory Visit: Payer: 59 | Admitting: Oncology

## 2018-03-14 NOTE — Progress Notes (Signed)
Riley Murray  Telephone:(336) [phone] Fax:(336) [phone]     ID: Riley Murray DOB: 06/09/1951  MR#: [id number]  YTK#:[id number]  Patient Care Team: Evert Kohl, MD as PCP - General (Family Medicine) Shakayla Hickox, Virgie Dad, MD as Consulting Physician (Oncology) PCP: Evert Kohl, MD SU: Alphonsa Overall MD OTHER MD:  CHIEF COMPLAINT: Left-sided breast cancer  CURRENT TREATMENT: tamoxifen   BREAST CANCER HISTORY: From the original intake note:  Riley Murray was taking a shower mid April 2016 when he noted a lump in his left breast. He brought it to his wife's attention (she is a former patient of mine with her own history of breast cancer), and then to his physician who set him up for bilateral diagnostic mammography with tomosynthesis and left breast [facility] April 20 05/18/2015. Mammography showed a breast density category A. In the upper inner quadrant of the left breast there was an irregular mass measuring 1.1 cm. This was palpable and mobile. By ultrasound there was an irregularly marginated mass at this location in the left breast measuring 1.1 cm. The left axilla appeared normal sonographically.  Biopsy of the left breast mass in question 02/24/2015 showed (SAA 16-07/02/2007) an invasive ductal carcinoma, grade 1, with a prognostic panel still pending.  The patient's subsequent history is as detailed below.  INTERVAL HISTORY: Riley Murray returns today for follow-up of his estrogen receptor positive breast cancer. He continues on tamoxifen, with good tolerance. In the beginning, he had headaches and low energy if he took it in the morning. He started taking it at night, and he now denies any issues.  Since his last visit, he underwent diagnostic bilateral mammography with CAD and tomography on 03/11/2018 at [facility] showing: breast density category A. There was no evidence of malignancy.     REVIEW OF SYSTEMS: Riley Murray reports that he is doing  well. He goes to the gym 3-4 times per week and he also plays tennis with his wife 2-3 times per week.  He is clocking more than 10,000 steps daily.  He denies unusual headaches, visual changes, nausea, vomiting, or dizziness. There has been no unusual cough, phlegm production, or pleurisy. This been no change in bowel or bladder habits. He denies unexplained fatigue or unexplained weight loss, bleeding, rash, or fever. A detailed review of systems was otherwise stable.    PAST MEDICAL HISTORY: Past Medical History:  Diagnosis Date  . Breast cancer 02/24/15   left breast  . Male breast cancer 03/08/15    PAST SURGICAL HISTORY: Past Surgical History:  Procedure Laterality Date  . COLONOSCOPY    . lumbar disc removal  1991  . MASTECTOMY W/ SENTINEL NODE BIOPSY Left 03/08/2015   Procedure: LEFT MASTECTOMY WITH LEFT AXILLARY SENTINEL LYMPH NODE BIOPSY;  Surgeon: Alphonsa Overall, MD;  Location: [facility];  Service: General;  Laterality: Left;    FAMILY HISTORY Family History  Problem Relation Age of Onset  . Esophageal cancer Neg Hx   . Rectal cancer Neg Hx   . Stomach cancer Neg Hx   . Stroke Mother   . Diabetes Mother   . Lung cancer Father 14   the patient's father died from lung cancer the age of 73. He had been a smoker. The patient's mother died at the age of 62 following a stroke in the setting of type 1 diabetes. The patient had one brother, one sister. There is no history of breast or ovarian cancer in the family.  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  They have now moved back to [place] permanently.  Daughter Riley Murray lives in [place], daughter Riley Murray in [place]. There are no grandchildren so far.    ADVANCED DIRECTIVES: In place   HEALTH MAINTENANCE: Social History   Tobacco Use  . Smoking status: Never Smoker  . Smokeless tobacco: Never Used  Substance Use  Topics  . Alcohol use: No  . Drug use: No     Colonoscopy: 2015/David Patterson  PSA:  Bone density:  Lipid panel:  No Known Allergies  Current Outpatient Medications  Medication Sig Dispense Refill  . ALPRAZolam (XANAX) 0.25 MG tablet Take 0.25 mg by mouth at bedtime as needed.    . cholecalciferol (VITAMIN D) 1000 UNITS tablet Take 1,000 Units by mouth daily.    . citalopram (CELEXA) 10 MG tablet Take 10 mg by mouth daily.    . fish oil-omega-3 fatty acids 1000 MG capsule Take 1 g by mouth daily.    . Flaxseed, Linseed, OIL Take 1,200 mg by mouth.    . Garlic Oil 0960 MG CAPS Take 1 capsule by mouth.    Marland Kitchen ibuprofen (ADVIL,MOTRIN) 200 MG tablet Take 200 mg by mouth every 6 (six) hours as needed.    . MULTIPLE VITAMIN PO Take 1 tablet by mouth.    . Red Yeast Rice 600 MG CAPS Take 1 capsule by mouth.    . tamoxifen (NOLVADEX) 20 MG tablet TAKE 1 TABLET BY MOUTH EVERY DAY 90 tablet 0  . vitamin C (ASCORBIC ACID) 500 MG tablet Take 500 mg by mouth daily.     No current facility-administered medications for this visit.     OBJECTIVE: Middle-aged white Murray in no acute distress  Vitals:   03/17/18 1134  BP: (!) 151/66  Pulse: 66  Resp: 18  Temp: 98.5 F (36.9 C)  SpO2: 100%     Body mass index is 25.14 kg/m.    ECOG FS:0 - Asymptomatic  Sclerae unicteric, pupils round and equal No cervical or supraclavicular adenopathy Lungs no rales or rhonchi Heart regular rate and rhythm Abd soft, nontender, positive bowel sounds MSK no focal spinal tenderness, no upper extremity lymphedema Neuro: nonfocal, well oriented, appropriate affect Breasts: The right breast is unremarkable.  The left breast is status post mastectomy.  There is no evidence of local recurrence.  Both axillae are benign. Skin: No suspicious lesions noted  LAB RESULTS:  CMP     Component Value Date/Time   NA 131 (L) 03/12/2017 1106   K 4.2 03/12/2017 1106   CL 93 (L) 03/17/2009 1530   CO2 26 03/12/2017  1106   GLUCOSE 95 03/12/2017 1106   BUN 11.0 03/12/2017 1106   CREATININE 0.9 03/12/2017 1106   CALCIUM 9.0 03/12/2017 1106   PROT 6.7 03/12/2017 1106   ALBUMIN 3.9 03/12/2017 1106   AST 27 03/12/2017 1106   ALT 22 03/12/2017 1106   ALKPHOS 52 03/12/2017 1106   BILITOT 0.83 03/12/2017 1106   GFRNONAA >60 03/17/2009 1530   GFRAA  03/17/2009 1530    >60        The eGFR has been calculated using the MDRD equation. This calculation has not been validated in all clinical situations. eGFR's persistently <60 mL/min signify possible Chronic Kidney Disease.    INo results found for: SPEP, UPEP  Lab Results  Component Value Date   WBC 4.5 03/17/2018  NEUTROABS 3.0 03/17/2018   HGB 13.6 03/17/2018   HCT 40.2 03/17/2018   MCV 96.5 03/17/2018   PLT 187 03/17/2018      Chemistry      Component Value Date/Time   NA 131 (L) 03/12/2017 1106   K 4.2 03/12/2017 1106   CL 93 (L) 03/17/2009 1530   CO2 26 03/12/2017 1106   BUN 11.0 03/12/2017 1106   CREATININE 0.9 03/12/2017 1106      Component Value Date/Time   CALCIUM 9.0 03/12/2017 1106   ALKPHOS 52 03/12/2017 1106   AST 27 03/12/2017 1106   ALT 22 03/12/2017 1106   BILITOT 0.83 03/12/2017 1106       No results found for: LABCA2  No components found for: LABCA125  No results for input(s): INR in the last 168 hours.  Urinalysis No results found for: COLORURINE, APPEARANCEUR, LABSPEC, PHURINE, GLUCOSEU, HGBUR, BILIRUBINUR, KETONESUR, PROTEINUR, UROBILINOGEN, NITRITE, LEUKOCYTESUR  STUDIES: Since his last visit, he underwent diagnostic bilateral mammography with CAD and tomography on 03/11/2018 at [facility] showing: breast density category A. There was no evidence of malignancy.   ASSESSMENT: 67 y.o. Riley Murray status post left breast biopsy 02/23/2015 for a clinical  T1c N0, stage IA invasive ductal carcinoma, grade 1, prognostic panel pending   (1) left mastectomy and sentinel lymph node sampling 03/08/2015 showed a  pT1b pN0, stage IA invasive ductal carcinoma, grade 1, with ample margins. Repeat HER-2 testingwas again negative  (2) genetics testing sent 03/02/2015, results through the Breast High/Moderate Risk gene panel offered by Gene[facility]xshowed no deleterious mutations in ATM, BRCA1, BRCA2, CDH1, CHEK2, PALB2, PTEN, STK11, and TP53.  (3) Oncotype DX Score of 9 predicts a risk of recurrence outside the breast within 10 years of 7% if the patient's only systemic therapy is tamoxifen for 5 years. It also predicts no benefit from chemotherapy.  (4) the patient did not need further local treatment with radiation therapy  (5) tamoxifen started 03/30/2015  PLAN: Riley Murray is now 3 years out from definitive surgery for his breast cancer with no evidence of disease recurrence.  This is very favorable.  He is tolerating tamoxifen well and the plan is to continue that a total of 5 years.  He is already scheduled for mammography next May and he will see me shortly after that  He has an excellent exercise program and I commended him for that  He knows to call for any issues that may develop before the next visit.   Augie Vane, Virgie Dad, MD  03/17/18 11:55 AM Medical Oncology and Hematology [facility] [address] [place], [place] [address] Tel. [phone]    Fax. [phone]  This document serves as a record of services personally performed by Lurline Del, MD. It was created on his behalf by Sheron Nightingale, a trained medical scribe. The creation of this record is based on the scribe's personal observations and the provider's statements to them.   I have reviewed the above documentation for accuracy and completeness, and I agree with the above.

## 2018-03-17 ENCOUNTER — Inpatient Hospital Stay: Payer: 59 | Attending: Oncology

## 2018-03-17 ENCOUNTER — Inpatient Hospital Stay: Payer: 59 | Admitting: Oncology

## 2018-03-17 VITALS — BP 151/66 | HR 66 | Temp 98.5°F | Resp 18 | Ht 70.0 in | Wt 175.2 lb

## 2018-03-17 DIAGNOSIS — Z923 Personal history of irradiation: Secondary | ICD-10-CM

## 2018-03-17 DIAGNOSIS — Z170 Estrogen receptor positive status [ER+]: Secondary | ICD-10-CM | POA: Diagnosis not present

## 2018-03-17 DIAGNOSIS — C50212 Malignant neoplasm of upper-inner quadrant of left female breast: Secondary | ICD-10-CM

## 2018-03-17 DIAGNOSIS — Z9012 Acquired absence of left breast and nipple: Secondary | ICD-10-CM | POA: Diagnosis not present

## 2018-03-17 DIAGNOSIS — Z79810 Long term (current) use of selective estrogen receptor modulators (SERMs): Secondary | ICD-10-CM

## 2018-03-17 DIAGNOSIS — C50222 Malignant neoplasm of upper-inner quadrant of left male breast: Secondary | ICD-10-CM

## 2018-03-17 LAB — CBC WITH DIFFERENTIAL/PLATELET
BASOS PCT: 1 %
Basophils Absolute: 0 10*3/uL (ref 0.0–0.1)
EOS PCT: 2 %
Eosinophils Absolute: 0.1 10*3/uL (ref 0.0–0.5)
HEMATOCRIT: 40.2 % (ref 38.4–49.9)
Hemoglobin: 13.6 g/dL (ref 13.0–17.1)
Lymphocytes Relative: 19 %
Lymphs Abs: 0.8 10*3/uL — ABNORMAL LOW (ref 0.9–3.3)
MCH: 32.6 pg (ref 27.2–33.4)
MCHC: 33.8 g/dL (ref 32.0–36.0)
MCV: 96.5 fL (ref 79.3–98.0)
MONO ABS: 0.5 10*3/uL (ref 0.1–0.9)
Monocytes Relative: 11 %
NEUTROS ABS: 3 10*3/uL (ref 1.5–6.5)
Neutrophils Relative %: 67 %
PLATELETS: 187 10*3/uL (ref 140–400)
RBC: 4.16 MIL/uL — ABNORMAL LOW (ref 4.20–5.82)
RDW: 12.9 % (ref 11.0–14.6)
WBC: 4.5 10*3/uL (ref 4.0–10.3)

## 2018-03-17 LAB — COMPREHENSIVE METABOLIC PANEL
ALBUMIN: 3.9 g/dL (ref 3.5–5.0)
ALT: 23 U/L (ref 0–55)
AST: 27 U/L (ref 5–34)
Alkaline Phosphatase: 54 U/L (ref 40–150)
Anion gap: 4 (ref 3–11)
BILIRUBIN TOTAL: 0.5 mg/dL (ref 0.2–1.2)
BUN: 11 mg/dL (ref 7–26)
CO2: 28 mmol/L (ref 22–29)
Calcium: 8.7 mg/dL (ref 8.4–10.4)
Chloride: 99 mmol/L (ref 98–109)
Creatinine, Ser: 0.9 mg/dL (ref 0.70–1.30)
GFR calc Af Amer: >60 mL/min (ref >60)
GFR calc non Af Amer: >60 mL/min (ref >60)
Glucose, Bld: 92 mg/dL (ref 70–140)
POTASSIUM: 4.2 mmol/L (ref 3.5–5.1)
Sodium: 131 mmol/L — ABNORMAL LOW (ref 136–145)
TOTAL PROTEIN: 6.6 g/dL (ref 6.4–8.3)

## 2018-03-17 MED ORDER — TAMOXIFEN CITRATE 20 MG PO TABS: 20.0000 mg | ORAL_TABLET | Freq: Every day | ORAL | 0 refills | Status: DC

## 2018-05-29 ENCOUNTER — Other Ambulatory Visit: Payer: Self-pay | Admitting: Oncology

## 2018-08-21 ENCOUNTER — Other Ambulatory Visit: Payer: Self-pay | Admitting: Oncology

## 2018-08-23 ENCOUNTER — Other Ambulatory Visit: Payer: Self-pay | Admitting: Internal Medicine

## 2018-08-23 DIAGNOSIS — E785 Hyperlipidemia, unspecified: Secondary | ICD-10-CM

## 2018-08-29 ENCOUNTER — Ambulatory Visit
Admission: RE | Admit: 2018-08-29 | Discharge: 2018-08-29 | Disposition: A | Discharge status: Home / Self Care | Payer: 59 | Source: Ambulatory Visit | Attending: Internal Medicine | Admitting: Internal Medicine

## 2018-08-29 DIAGNOSIS — E785 Hyperlipidemia, unspecified: Secondary | ICD-10-CM

## 2018-11-15 ENCOUNTER — Other Ambulatory Visit: Payer: Self-pay | Admitting: Oncology

## 2018-11-18 ENCOUNTER — Other Ambulatory Visit: Payer: Self-pay

## 2019-01-29 ENCOUNTER — Other Ambulatory Visit: Payer: Self-pay | Admitting: *Deleted

## 2019-01-29 MED ORDER — TAMOXIFEN CITRATE 20 MG PO TABS: 20.0000 mg | ORAL_TABLET | Freq: Every day | ORAL | 6 refills | Status: DC

## 2019-03-17 ENCOUNTER — Telehealth: Payer: Self-pay | Admitting: Oncology

## 2019-03-17 NOTE — Telephone Encounter (Signed)
Spoke with pt re 03/19/19 office visit and he agreed to convert into [employer].  Emailed step-by-step instructions how to download and access [employer].

## 2019-03-18 ENCOUNTER — Telehealth: Payer: Self-pay | Admitting: Oncology

## 2019-03-18 NOTE — Telephone Encounter (Signed)
Contacted patient to verify appt. thru webex for pre reg

## 2019-03-18 NOTE — Progress Notes (Signed)
[place]  Telephone:([phone] Fax:(336) [phone]     ID: Riley Murray DOB: 1951/08/21  MR#: [id number]  PQZ#:[id number]  Patient Care Team: Shon Baton, MD as PCP - General (Internal Medicine) Guida Asman, Virgie Dad, MD as Consulting Physician (Oncology) Shon Baton, MD as Consulting Physician (Internal Medicine) Sydnee Levans, MD as Referring Physician (Dermatology) SU: Alphonsa Overall MD OTHER MD:  I connected with Riley Murray on 03/19/19 at  3:00 PM EDT by telephone visit and verified that I am speaking with the correct person using two identifiers.   I discussed the limitations, risks, security and privacy concerns of performing an evaluation and management service by telemedicine and the availability of in-person appointments. I also discussed with the patient that there may be a patient responsible charge related to this service. The patient expressed understanding and agreed to proceed.   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   He continues on tamoxifen, with good tolerance. He reports no noticeable side effects.  He had his yearly mammography at [facility] a week ago.  We will request those results.  But by his report there was no evidence of cancer  He had a cardiac MRI on 08/29/2018 showing a calcium score of 3.98.    REVIEW OF SYSTEMS: Riley Murray reports doing well overall. He states he and his wife Riley Murray moved to [place] about a month ago. They both have new jobs; he is in Scientist, research (life sciences) estate. He reports his sister, who lives in [place], got the coronavirus and had a rough go of it but has since recovered. He walks his dog for exercise. The  patient denies unusual headaches, visual changes, nausea, vomiting, stiff neck, dizziness, or gait imbalance. There has been no cough, phlegm production, or pleurisy, no chest pain or pressure, and no change in bowel or bladder habits. The patient denies fever, rash, bleeding, unexplained fatigue or unexplained weight loss. A detailed review of systems was otherwise entirely negative.   BREAST CANCER HISTORY: From the original intake note:  Riley Murray was taking a shower mid April 2016 when he noted a lump in his left breast. He brought it to his wife's attention (she is a former patient of mine with her own history of breast cancer), and then to his physician who set him up for bilateral diagnostic mammography with tomosynthesis and [facility] April 20 05/18/2015. Mammography showed a breast density category A. In the upper inner quadrant of the left breast there was an irregular mass measuring 1.1 cm. This was palpable and mobile. By ultrasound there was an irregularly marginated mass at this location in the left breast measuring 1.1 cm. The left axilla appeared normal sonographically.  Biopsy of the left breast mass in question 02/24/2015 showed (SAA 16-07/02/2007) an invasive ductal carcinoma, grade 1, with a prognostic panel still pending.  The patient's subsequent history is as detailed below.   PAST MEDICAL HISTORY: Past Medical History:  Diagnosis Date  . Breast cancer 02/24/15   left breast  . Male breast cancer 03/08/15    PAST SURGICAL HISTORY: Past Surgical History:  Procedure Laterality Date  . COLONOSCOPY    . lumbar disc removal  1991  . MASTECTOMY W/  SENTINEL NODE BIOPSY Left 03/08/2015   Procedure: LEFT MASTECTOMY WITH LEFT AXILLARY SENTINEL LYMPH NODE BIOPSY;  Surgeon: Alphonsa Overall, MD;  Location: [place];  Service: General;  Laterality: Left;    FAMILY HISTORY Family History  Problem Relation Age of Onset  . Esophageal  cancer Neg Hx   . Rectal cancer Neg Hx   . Stomach cancer Neg Hx   . Stroke Mother   . Diabetes Mother   . Lung cancer Father 55   the patient's father died from lung cancer the age of 26. He had been a smoker. The patient's mother died at the age of 74 following a stroke in the setting of type 1 diabetes. The patient had one brother, one sister. There is no history of breast or ovarian cancer in the family.   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 His wife Riley Murray also works.  They have moved to [place], but they still have their apartment in [place]. At home with him is his wife and their dog. Daughter Riley Murray lives in [place], daughter Riley Murray in [place]. There are no grandchildren so far.    ADVANCED DIRECTIVES: In place   HEALTH MAINTENANCE: Social History   Tobacco Use  . Smoking status: Never Smoker  . Smokeless tobacco: Never Used  Substance Use Topics  . Alcohol use: No  . Drug use: No     Colonoscopy: 2015/David Patterson  PSA:  Bone density:  Lipid panel:  No Known Allergies  Current Outpatient Medications  Medication Sig Dispense Refill  . ALPRAZolam (XANAX) 0.25 MG tablet Take 0.25 mg by mouth at bedtime as needed.    . cholecalciferol (VITAMIN D) 1000 UNITS tablet Take 1,000 Units by mouth daily.    . citalopram (CELEXA) 10 MG tablet Take 10 mg by mouth daily.    . fish oil-omega-3 fatty acids 1000 MG capsule Take 1 g by mouth daily.    . Flaxseed, Linseed, OIL Take 1,200 mg by mouth.    Marland Kitchen ibuprofen (ADVIL,MOTRIN) 200 MG tablet Take 200 mg by mouth every 6 (six) hours as needed.    . MULTIPLE VITAMIN PO Take 1 tablet by mouth.    . tamoxifen (NOLVADEX) 20 MG tablet Take 1 tablet (20 mg total) by mouth daily. 90 tablet 4  . vitamin C (ASCORBIC ACID) 500 MG tablet Take 500 mg by mouth daily.     No current facility-administered medications for this visit.     OBJECTIVE: Middle-aged white man   There were no  vitals filed for this visit.   There is no height or weight on file to calculate BMI.    ECOG FS:0 - Asymptomatic   LAB RESULTS:  CMP     Component Value Date/Time   NA 131 (L) 03/17/2018 1121   NA 131 (L) 03/12/2017 1106   K 4.2 03/17/2018 1121   K 4.2 03/12/2017 1106   CL 99 03/17/2018 1121   CO2 28 03/17/2018 1121   CO2 26 03/12/2017 1106   GLUCOSE 92 03/17/2018 1121   GLUCOSE 95 03/12/2017 1106   BUN 11 03/17/2018 1121   BUN 11.0 03/12/2017 1106   CREATININE 0.90 03/17/2018 1121   CREATININE 0.9 03/12/2017 1106   CALCIUM 8.7 03/17/2018 1121   CALCIUM 9.0 03/12/2017 1106   PROT 6.6 03/17/2018 1121   PROT 6.7 03/12/2017 1106   ALBUMIN 3.9 03/17/2018 1121   ALBUMIN 3.9 03/12/2017 1106   AST 27 03/17/2018 1121  AST 27 03/12/2017 1106   ALT 23 03/17/2018 1121   ALT 22 03/12/2017 1106   ALKPHOS 54 03/17/2018 1121   ALKPHOS 52 03/12/2017 1106   BILITOT 0.5 03/17/2018 1121   BILITOT 0.83 03/12/2017 1106   GFRNONAA >60 03/17/2018 1121   GFRAA >60 03/17/2018 1121    INo results found for: SPEP, UPEP  Lab Results  Component Value Date   WBC 4.5 03/17/2018   NEUTROABS 3.0 03/17/2018   HGB 13.6 03/17/2018   HCT 40.2 03/17/2018   MCV 96.5 03/17/2018   PLT 187 03/17/2018      Chemistry      Component Value Date/Time   NA 131 (L) 03/17/2018 1121   NA 131 (L) 03/12/2017 1106   K 4.2 03/17/2018 1121   K 4.2 03/12/2017 1106   CL 99 03/17/2018 1121   CO2 28 03/17/2018 1121   CO2 26 03/12/2017 1106   BUN 11 03/17/2018 1121   BUN 11.0 03/12/2017 1106   CREATININE 0.90 03/17/2018 1121   CREATININE 0.9 03/12/2017 1106      Component Value Date/Time   CALCIUM 8.7 03/17/2018 1121   CALCIUM 9.0 03/12/2017 1106   ALKPHOS 54 03/17/2018 1121   ALKPHOS 52 03/12/2017 1106   AST 27 03/17/2018 1121   AST 27 03/12/2017 1106   ALT 23 03/17/2018 1121   ALT 22 03/12/2017 1106   BILITOT 0.5 03/17/2018 1121   BILITOT 0.83 03/12/2017 1106       No results found for:  LABCA2  No components found for: LABCA125  No results for input(s): INR in the last 168 hours.  Urinalysis No results found for: COLORURINE, APPEARANCEUR, LABSPEC, PHURINE, GLUCOSEU, HGBUR, BILIRUBINUR, KETONESUR, PROTEINUR, UROBILINOGEN, NITRITE, LEUKOCYTESUR  STUDIES: Since his last visit, he underwent diagnostic bilateral mammography with CAD and tomography on 03/11/2018 at [facility] showing: breast density category A. There was no evidence of malignancy.   ASSESSMENT: 68 y.o. [place] man status post left breast biopsy 02/23/2015 for a clinical  T1c N0, stage IA invasive ductal carcinoma, grade 1, prognostic panel pending   (1) left mastectomy and sentinel lymph node sampling 03/08/2015 showed a pT1b pN0, stage IA invasive ductal carcinoma, grade 1, with ample margins. Repeat HER-2 testingwas again negative  (2) genetics testing sent 03/02/2015, results through the Breast High/Moderate Risk gene panel offered by GeneDxshowed no deleterious mutations in ATM, BRCA1, BRCA2, CDH1, CHEK2, PALB2, PTEN, STK11, and TP53.  (3) Oncotype DX Score of 9 predicts a risk of recurrence outside the breast within 10 years of 7% if the patient's only systemic therapy is tamoxifen for 5 years. It also predicts no benefit from chemotherapy.  (4) the patient did not need further local treatment with radiation therapy  (5) tamoxifen started 03/30/2015  PLAN: Riley Murray is now 4 years out from definitive surgery for his breast cancer with no evidence of disease recurrence.  This is very favorable.  He is tolerating tamoxifen without any unusual side effects and the plan is to continue that 1 more year to a total of 5 years.  I offered to set him up with an oncologist in [place] but actually he wants to come back to a year from now get his mammogram see me" get out of the cancer business".  They are taking appropriate coping precautions  He knows to call for any other issues that may develop before the next  visit here.     Trust Crago, Virgie Dad, MD  03/19/19 3:14 PM Medical Oncology and Hematology [facility]  [address], [place] [address] Tel. [phone]    Fax. [phone]   I, Riley Murray, am acting as scribe for Dr. Virgie Dad. Jaimon Bugaj.  I, Lurline Del MD, have reviewed the above documentation for accuracy and completeness, and I agree with the above.

## 2019-03-19 ENCOUNTER — Other Ambulatory Visit: Payer: 59

## 2019-03-19 ENCOUNTER — Inpatient Hospital Stay: Payer: 59 | Attending: Oncology | Admitting: Oncology

## 2019-03-19 DIAGNOSIS — Z923 Personal history of irradiation: Secondary | ICD-10-CM

## 2019-03-19 DIAGNOSIS — Z9012 Acquired absence of left breast and nipple: Secondary | ICD-10-CM

## 2019-03-19 DIAGNOSIS — Z79810 Long term (current) use of selective estrogen receptor modulators (SERMs): Secondary | ICD-10-CM

## 2019-03-19 DIAGNOSIS — C50222 Malignant neoplasm of upper-inner quadrant of left male breast: Secondary | ICD-10-CM

## 2019-03-19 DIAGNOSIS — Z170 Estrogen receptor positive status [ER+]: Secondary | ICD-10-CM

## 2019-03-19 DIAGNOSIS — C50212 Malignant neoplasm of upper-inner quadrant of left female breast: Secondary | ICD-10-CM

## 2019-03-19 DIAGNOSIS — Z79899 Other long term (current) drug therapy: Secondary | ICD-10-CM

## 2019-03-19 MED ORDER — TAMOXIFEN CITRATE 20 MG PO TABS: 20.0000 mg | ORAL_TABLET | Freq: Every day | ORAL | 6 refills | Status: DC

## 2019-03-19 MED ORDER — TAMOXIFEN CITRATE 20 MG PO TABS: 20.0000 mg | ORAL_TABLET | Freq: Every day | ORAL | 4 refills | Status: AC

## 2019-03-20 ENCOUNTER — Telehealth: Payer: Self-pay | Admitting: Oncology

## 2019-03-20 NOTE — Telephone Encounter (Signed)
Tried to reach regarding schedule °

## 2019-05-25 IMAGING — CT CT HEART SCORING
3 series · 14 of 20 positions shown, 16 images · non-contrast
Comparison: None.

CLINICAL DATA: Hyperlipidemia

EXAM:
CT HEART FOR CALCIUM SCORING
TECHNIQUE: CT heart was performed on a 64 channel system using prospective ECG
gating.
A non-contrast exam for calcium scoring was performed.
Note that this exam targets the heart and the chest was not imaged
in its entirety.

[Series 2: calcium scoring 2.00 qr36 bestdiast 70% · axial · 0.34mm/px · z∈[+1581,+1665]mm · 4 of 70 slices shown]
[im 14/70  vessel]
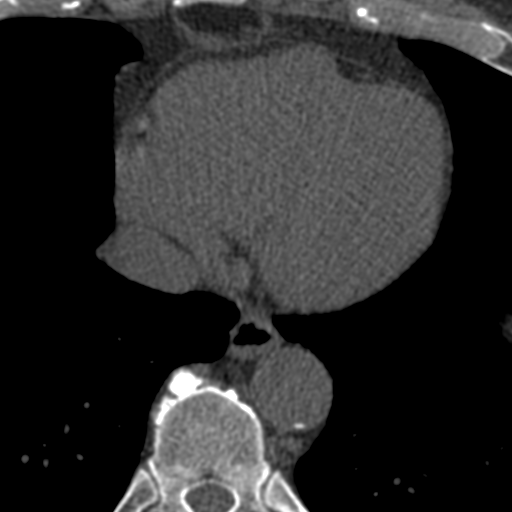
[im 28/70  vessel]
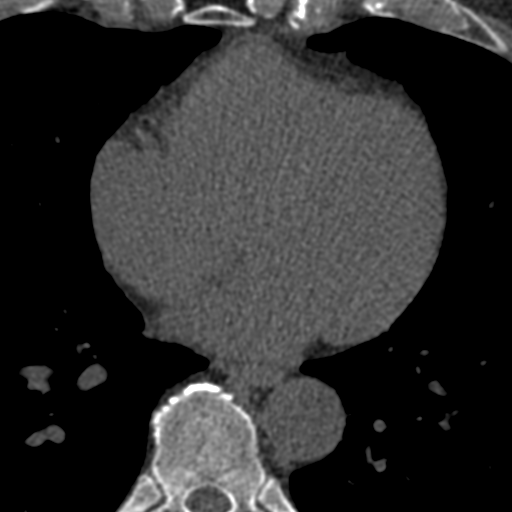
[im 42/70  vessel]
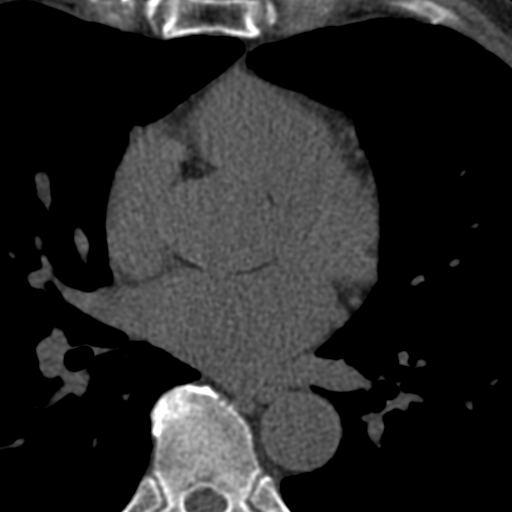
[im 56/70  vessel]
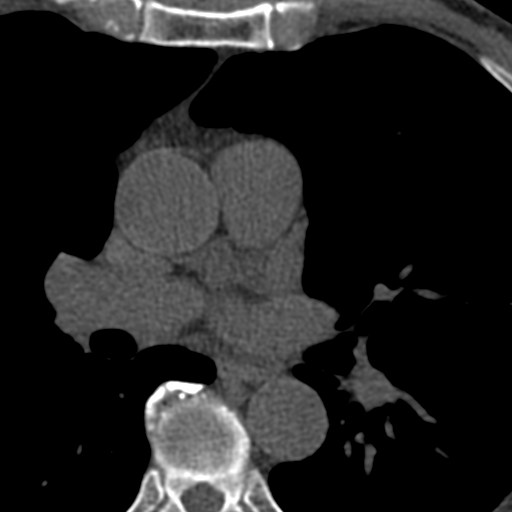

[Series 3: calcium scoring 2.00 br40 bestdiast 70% ax fov · axial · 0.52mm/px · z∈[+1577,+1669]mm · 5 of 70 slices shown, 7 images]
[im 12/70  vessel]
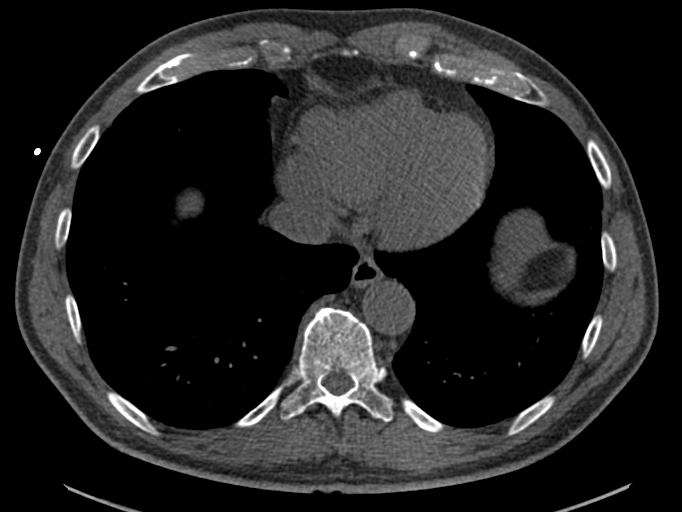
[im 12/70  lung]
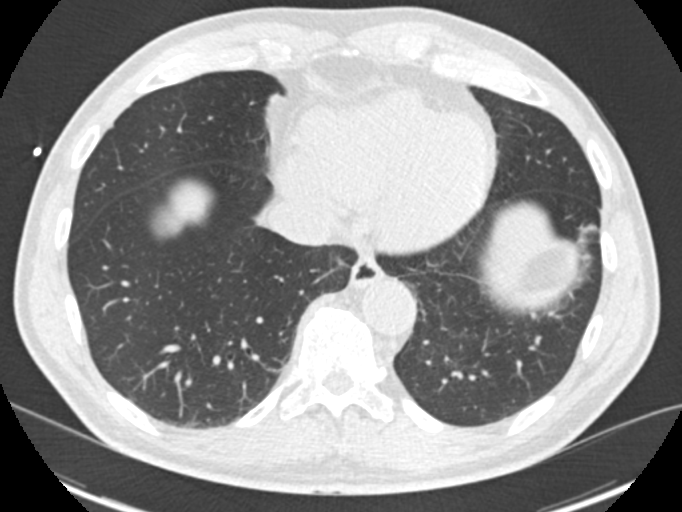
[im 24/70  vessel]
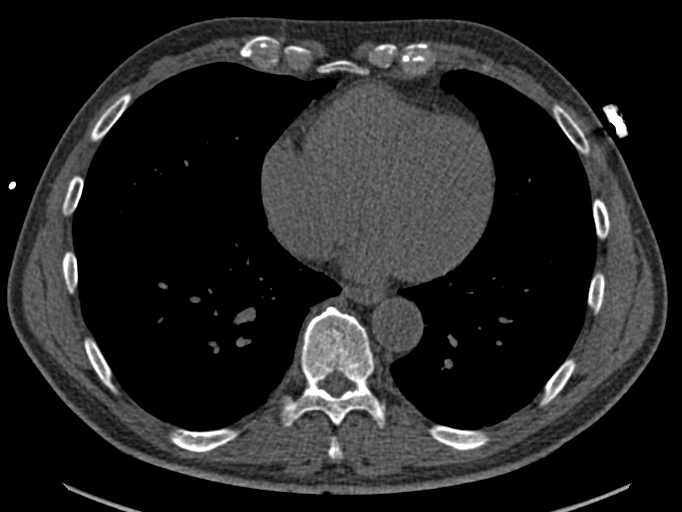
[im 35/70  vessel]
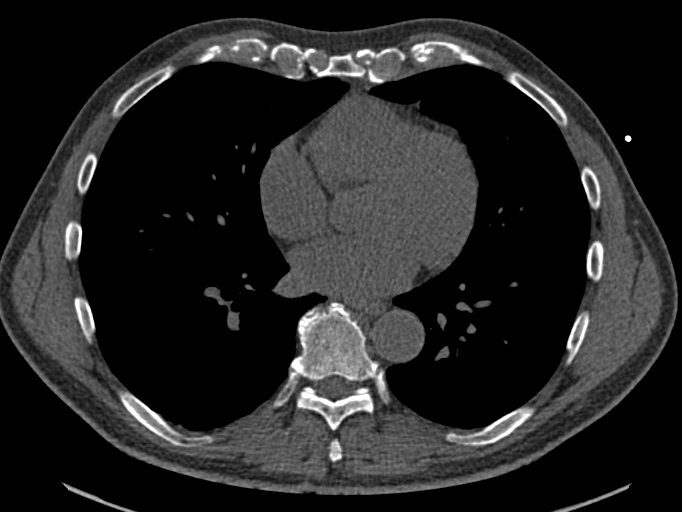
[im 47/70  vessel]
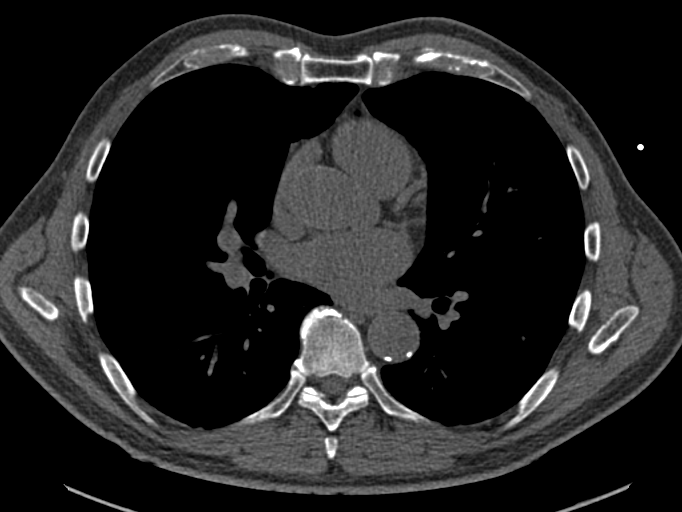
[im 58/70  vessel]
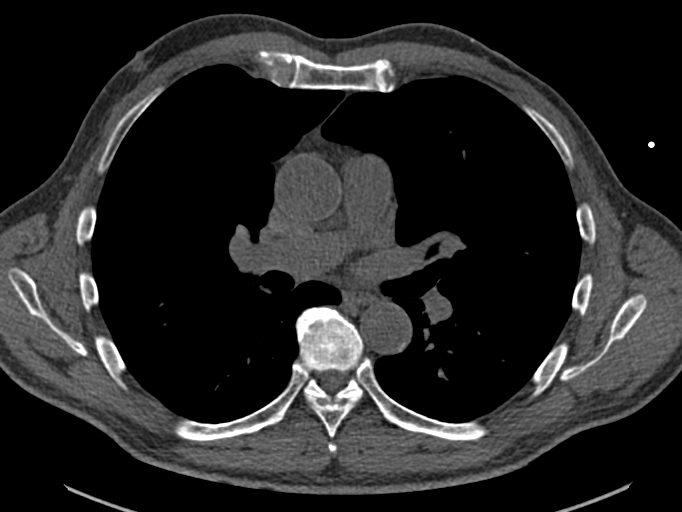
[im 58/70  lung]
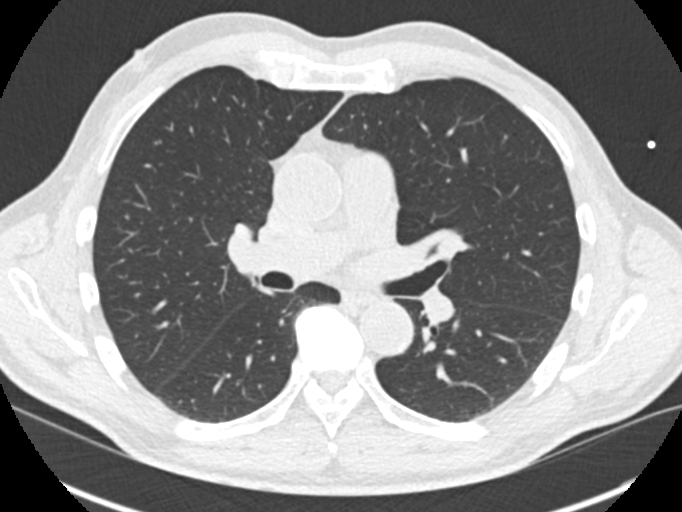

[Series 9: calcium scoring 2.00 br60 bestdiast 70% ax fov · axial · 0.52mm/px · z∈[+1577,+1669]mm · 5 of 70 slices shown]
[im 12/70  vessel]
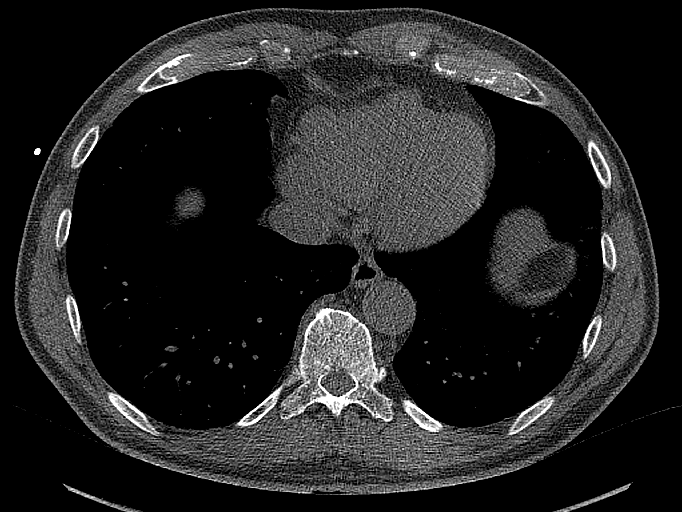
[im 24/70  vessel]
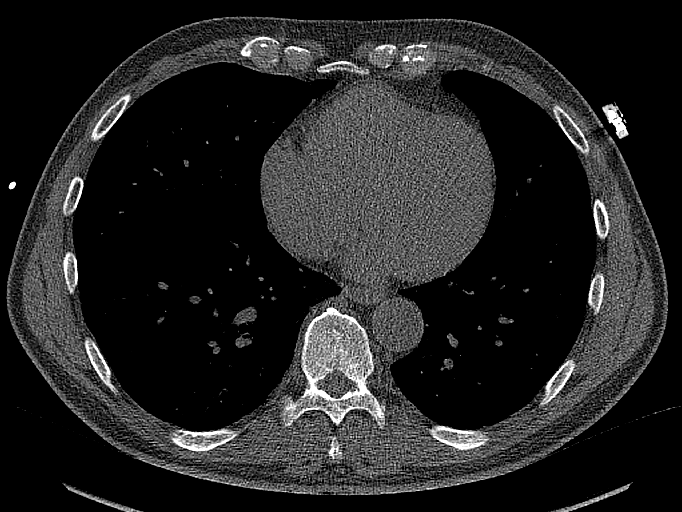
[im 35/70  vessel]
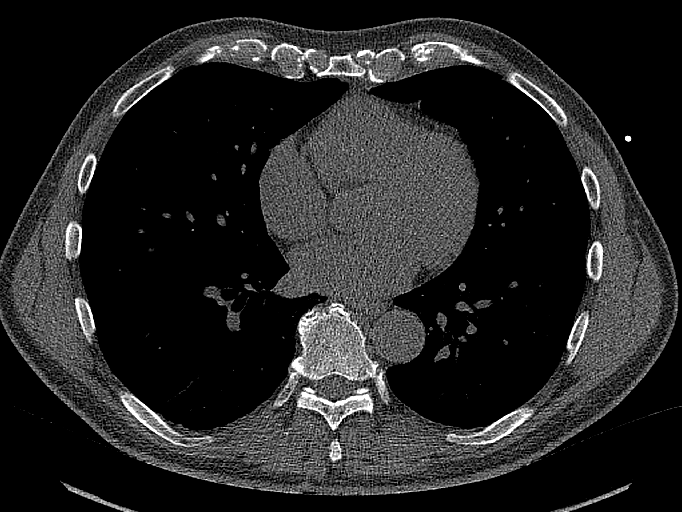
[im 47/70  vessel]
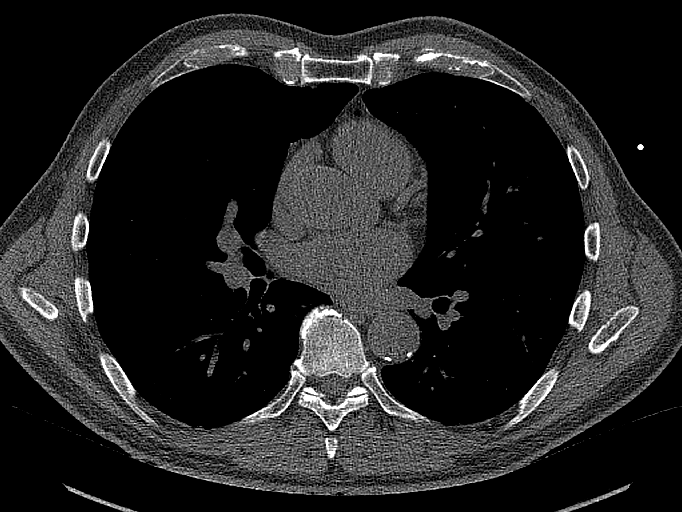
[im 58/70  vessel]
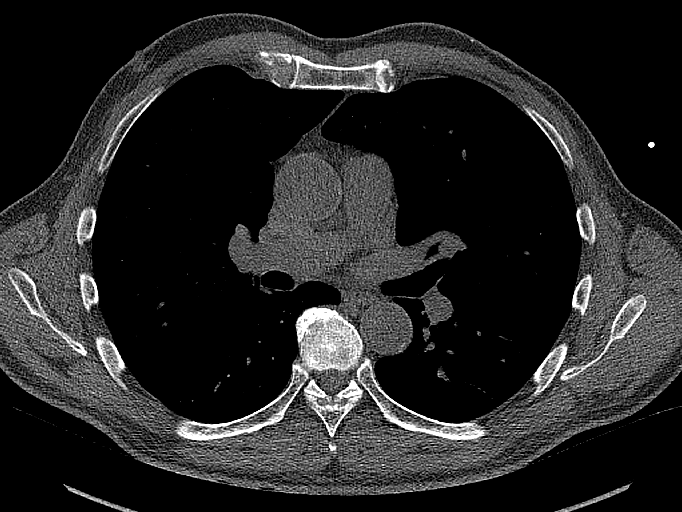

[14 of 20 positions shown; findings below may reference images not displayed]

FINDINGS: Technical quality: Good.

CORONARY CALCIUM

Total Agatston Score: 3.98 with punctate calcifications in the left
main and left anterior descending coronary artery.

[HOSPITAL] percentile:  21

OTHER FINDINGS:

Cardiovascular: Mild-to-moderate atherosclerotic calcifications in
the descending thoracic aorta. Heart is normal size. Aorta is normal
caliber.

Mediastinum/Nodes: No adenopathy in the lower mediastinum or hila.

Lungs/Pleura: Linear atelectasis or scarring the lung bases. No
effusions.

Upper Abdomen: Imaging into the upper abdomen shows no acute
findings.

Musculoskeletal: Chest wall soft tissues are unremarkable. No acute
bony abnormality.
IMPRESSION: The observed calcium score of 3.98 is at the percentile 21 for
subjects of the same age, gender and race/ethnicity who are free of
clinical cardiovascular disease and treated diabetes.

Bibasilar atelectasis or scarring.

## 2019-12-14 ENCOUNTER — Other Ambulatory Visit: Payer: Self-pay | Admitting: *Deleted

## 2020-03-22 ENCOUNTER — Inpatient Hospital Stay: Payer: 59

## 2020-03-22 ENCOUNTER — Other Ambulatory Visit: Payer: Self-pay

## 2020-03-22 ENCOUNTER — Other Ambulatory Visit: Payer: Self-pay | Admitting: *Deleted

## 2020-03-22 ENCOUNTER — Inpatient Hospital Stay: Payer: 59 | Attending: Oncology | Admitting: Oncology

## 2020-03-22 VITALS — BP 134/67 | HR 64 | Temp 98.7°F | Resp 20 | Ht 70.0 in | Wt 174.6 lb

## 2020-03-22 DIAGNOSIS — Z170 Estrogen receptor positive status [ER+]: Secondary | ICD-10-CM | POA: Insufficient documentation

## 2020-03-22 DIAGNOSIS — Z79810 Long term (current) use of selective estrogen receptor modulators (SERMs): Secondary | ICD-10-CM | POA: Diagnosis not present

## 2020-03-22 DIAGNOSIS — C50212 Malignant neoplasm of upper-inner quadrant of left female breast: Secondary | ICD-10-CM

## 2020-03-22 DIAGNOSIS — C50222 Malignant neoplasm of upper-inner quadrant of left male breast: Secondary | ICD-10-CM | POA: Diagnosis not present

## 2020-03-22 LAB — CBC WITH DIFFERENTIAL (CANCER CENTER ONLY)
Abs Immature Granulocytes: 0.01 10*3/uL (ref 0.00–0.07)
Basophils Absolute: 0.1 10*3/uL (ref 0.0–0.1)
Basophils Relative: 1 %
Eosinophils Absolute: 0.1 10*3/uL (ref 0.0–0.5)
Eosinophils Relative: 2 %
HCT: 40.6 % (ref 39.0–52.0)
Hemoglobin: 13.5 g/dL (ref 13.0–17.0)
Immature Granulocytes: 0 %
Lymphocytes Relative: 21 %
Lymphs Abs: 1.1 10*3/uL (ref 0.7–4.0)
MCH: 32.4 pg (ref 26.0–34.0)
MCHC: 33.3 g/dL (ref 30.0–36.0)
MCV: 97.4 fL (ref 80.0–100.0)
Monocytes Absolute: 0.5 10*3/uL (ref 0.1–1.0)
Monocytes Relative: 9 %
Neutro Abs: 3.7 10*3/uL (ref 1.7–7.7)
Neutrophils Relative %: 67 %
Platelet Count: 177 10*3/uL (ref 150–400)
RBC: 4.17 MIL/uL — ABNORMAL LOW (ref 4.22–5.81)
RDW: 12.2 % (ref 11.5–15.5)
WBC Count: 5.4 10*3/uL (ref 4.0–10.5)
nRBC: 0 % (ref 0.0–0.2)

## 2020-03-22 LAB — CMP (CANCER CENTER ONLY)
ALT: 18 U/L (ref 0–44)
AST: 19 U/L (ref 15–41)
Albumin: 3.6 g/dL (ref 3.5–5.0)
Alkaline Phosphatase: 47 U/L (ref 38–126)
Anion gap: 5 (ref 5–15)
BUN: 10 mg/dL (ref 8–23)
CO2: 28 mmol/L (ref 22–32)
Calcium: 8.3 mg/dL — ABNORMAL LOW (ref 8.9–10.3)
Chloride: 98 mmol/L (ref 98–111)
Creatinine: 0.86 mg/dL (ref 0.61–1.24)
GFR, Est AFR Am: >60 mL/min (ref >60)
GFR, Estimated: >60 mL/min (ref >60)
Glucose, Bld: 96 mg/dL (ref 70–99)
Potassium: 4.2 mmol/L (ref 3.5–5.1)
Sodium: 131 mmol/L — ABNORMAL LOW (ref 135–145)
Total Bilirubin: 0.6 mg/dL (ref 0.3–1.2)
Total Protein: 6.2 g/dL — ABNORMAL LOW (ref 6.5–8.1)

## 2020-03-22 NOTE — Progress Notes (Signed)
[place]  Telephone:(336) [phone] Fax:([phone]     ID: Riley Murray DOB: 1951-07-14  MR#: [id number]  JYN#:[id number]  Patient Care Team: Shon Baton, MD as PCP - General (Internal Medicine) Meleah Demeyer, Virgie Dad, MD as Consulting Physician (Oncology) Shon Baton, MD as Consulting Physician (Internal Medicine) Sydnee Levans, MD as Referring Physician (Dermatology) SU: Alphonsa Overall MD OTHER MD:   CHIEF COMPLAINT: Left-sided estrogen receptor positive breast cancer  CURRENT TREATMENT: Completing 5 years of tamoxifen   INTERVAL HISTORY: Riley Murray returns today for follow-up of his estrogen receptor positive breast cancer.   He completes 5 years of tamoxifen this month.  He has tolerated it well, with no side effects that he is aware of and certainly no complications.  He underwent bilateral diagnostic mammography with tomography at [facility] earlier today with results pending but per patient's verbal report no evidence of malignancy   REVIEW OF SYSTEMS: Riley Murray exercises regularly chiefly by playing tennis.  He received both doses of the Pfizer vaccine without complications beyond minimal chills and a mild headache.  A detailed review of systems today was otherwise entirely benign   BREAST CANCER HISTORY: From the original intake note:  Riley Murray was taking a shower mid April 2016 when he noted a lump in his left breast. He brought it to his wife's attention (she is a former patient of mine with her own history of breast cancer), and then to his physician who set him up for bilateral diagnostic mammography with tomosynthesis and [facility] April 20 05/18/2015. Mammography showed a breast density category A. In the upper inner quadrant of the left breast there was an irregular mass measuring 1.1 cm. This was palpable and mobile. By ultrasound there was an irregularly marginated mass at this location in the left breast measuring 1.1 cm.  The left axilla appeared normal sonographically.  Biopsy of the left breast mass in question 02/24/2015 showed (SAA 16-07/02/2007) an invasive ductal carcinoma, grade 1, with a prognostic panel still pending.  The patient's subsequent history is as detailed below.   PAST MEDICAL HISTORY: Past Medical History:  Diagnosis Date  . Breast cancer 02/24/15   left breast  . Male breast cancer 03/08/15    PAST SURGICAL HISTORY: Past Surgical History:  Procedure Laterality Date  . COLONOSCOPY    . lumbar disc removal  1991  . MASTECTOMY W/ SENTINEL NODE BIOPSY Left 03/08/2015   Procedure: LEFT MASTECTOMY WITH LEFT AXILLARY SENTINEL LYMPH NODE BIOPSY;  Surgeon: Alphonsa Overall, MD;  Location: [facility];  Service: General;  Laterality: Left;    FAMILY HISTORY Family History  Problem Relation Age of Onset  . Esophageal cancer Neg Hx   . Rectal cancer Neg Hx   . Stomach cancer Neg Hx   . Stroke Mother   . Diabetes Mother   . Lung cancer Father 17   the patient's father died from lung cancer the age of 66. He had been a smoker. The patient's mother died at the age of 70 following a stroke in the setting of type 1 diabetes. The patient had one brother, one sister. There is no history of breast or ovarian cancer in the family.   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  They have moved to [place], but they still have their apartment in [place]. At home with him is his wife and their dog. Daughter Riley Murray lives in [place], daughter Riley Murray in  [place]. There are no grandchildren so far.    ADVANCED DIRECTIVES: In place   HEALTH MAINTENANCE: Social History   Tobacco Use  . Smoking status: Never Smoker  . Smokeless tobacco: Never Used  Substance Use Topics  . Alcohol use: No  . Drug use: No     Colonoscopy: 2015/David Patterson  PSA:  Bone density:  Lipid panel:  No Known Allergies  Current  Outpatient Medications  Medication Sig Dispense Refill  . ALPRAZolam (XANAX) 0.25 MG tablet Take 0.25 mg by mouth at bedtime as needed.    . cholecalciferol (VITAMIN D) 1000 UNITS tablet Take 1,000 Units by mouth daily.    . citalopram (CELEXA) 10 MG tablet Take 10 mg by mouth daily.    . fish oil-omega-3 fatty acids 1000 MG capsule Take 1 g by mouth daily.    . Flaxseed, Linseed, OIL Take 1,200 mg by mouth.    Marland Kitchen ibuprofen (ADVIL,MOTRIN) 200 MG tablet Take 200 mg by mouth every 6 (six) hours as needed.    . MULTIPLE VITAMIN PO Take 1 tablet by mouth.    . tamoxifen (NOLVADEX) 20 MG tablet Take 1 tablet (20 mg total) by mouth daily. 90 tablet 4  . vitamin C (ASCORBIC ACID) 500 MG tablet Take 500 mg by mouth daily.     No current facility-administered medications for this visit.    OBJECTIVE:  white man who appears well  Vitals:   03/22/20 1520  BP: 134/67  Pulse: 64  Resp: 20  Temp: 98.7 F (37.1 C)  SpO2: 99%     Body mass index is 25.05 kg/m.    ECOG FS:0 - Asymptomatic  Sclerae unicteric, EOMs intact Wearing a mask No cervical or supraclavicular adenopathy Lungs no rales or rhonchi Heart regular rate and rhythm Abd soft, nontender, positive bowel sounds MSK no focal spinal tenderness, no upper extremity lymphedema Neuro: nonfocal, well oriented, appropriate affect Breasts: The right breast is benign.  The left breast is status post mastectomy.  There is no evidence of local recurrence.  Both axillae are benign   LAB RESULTS:  CMP     Component Value Date/Time   NA 131 (L) 03/22/2020 1454   NA 131 (L) 03/12/2017 1106   K 4.2 03/22/2020 1454   K 4.2 03/12/2017 1106   CL 98 03/22/2020 1454   CO2 28 03/22/2020 1454   CO2 26 03/12/2017 1106   GLUCOSE 96 03/22/2020 1454   GLUCOSE 95 03/12/2017 1106   BUN 10 03/22/2020 1454   BUN 11.0 03/12/2017 1106   CREATININE 0.86 03/22/2020 1454   CREATININE 0.9 03/12/2017 1106   CALCIUM 8.3 (L) 03/22/2020 1454   CALCIUM 9.0  03/12/2017 1106   PROT 6.2 (L) 03/22/2020 1454   PROT 6.7 03/12/2017 1106   ALBUMIN 3.6 03/22/2020 1454   ALBUMIN 3.9 03/12/2017 1106   AST 19 03/22/2020 1454   AST 27 03/12/2017 1106   ALT 18 03/22/2020 1454   ALT 22 03/12/2017 1106   ALKPHOS 47 03/22/2020 1454   ALKPHOS 52 03/12/2017 1106   BILITOT 0.6 03/22/2020 1454   BILITOT 0.83 03/12/2017 1106   GFRNONAA >60 03/22/2020 1454   GFRAA >60 03/22/2020 1454    INo results found for: SPEP, UPEP  Lab Results  Component Value Date   WBC 5.4 03/22/2020   NEUTROABS 3.7 03/22/2020   HGB 13.5 03/22/2020   HCT 40.6 03/22/2020   MCV 97.4 03/22/2020   PLT 177 03/22/2020  Chemistry      Component Value Date/Time   NA 131 (L) 03/22/2020 1454   NA 131 (L) 03/12/2017 1106   K 4.2 03/22/2020 1454   K 4.2 03/12/2017 1106   CL 98 03/22/2020 1454   CO2 28 03/22/2020 1454   CO2 26 03/12/2017 1106   BUN 10 03/22/2020 1454   BUN 11.0 03/12/2017 1106   CREATININE 0.86 03/22/2020 1454   CREATININE 0.9 03/12/2017 1106      Component Value Date/Time   CALCIUM 8.3 (L) 03/22/2020 1454   CALCIUM 9.0 03/12/2017 1106   ALKPHOS 47 03/22/2020 1454   ALKPHOS 52 03/12/2017 1106   AST 19 03/22/2020 1454   AST 27 03/12/2017 1106   ALT 18 03/22/2020 1454   ALT 22 03/12/2017 1106   BILITOT 0.6 03/22/2020 1454   BILITOT 0.83 03/12/2017 1106       No results found for: LABCA2  No components found for: LABCA125  No results for input(s): INR in the last 168 hours.  Urinalysis No results found for: COLORURINE, APPEARANCEUR, LABSPEC, PHURINE, GLUCOSEU, HGBUR, BILIRUBINUR, KETONESUR, PROTEINUR, UROBILINOGEN, NITRITE, LEUKOCYTESUR  STUDIES: No results found.   ASSESSMENT: 69 y.o. [place] man status post left breast biopsy 02/23/2015 for a clinical  T1c N0, stage IA invasive ductal carcinoma, grade 1, prognostic panel pending   (1) left mastectomy and sentinel lymph node sampling 03/08/2015 showed a pT1b pN0, stage IA invasive  ductal carcinoma, grade 1, with ample margins. Repeat HER-2 testingwas again negative  (2) genetics testing sent 03/02/2015, results through the Breast High/Moderate Risk gene panel offered by GeneDxshowed no deleterious mutations in ATM, BRCA1, BRCA2, CDH1, CHEK2, PALB2, PTEN, STK11, and TP53.  (3) Oncotype DX Score of 9 predicts a risk of recurrence outside the breast within 10 years of 7% if the patient's only systemic therapy is tamoxifen for 5 years. It also predicts no benefit from chemotherapy.  (4) the patient did not need further local treatment with radiation therapy  (5) tamoxifen started 03/30/2015, completing five years May 2021   PLAN: Riley Murray is now 5 years out from definitive surgery for his breast cancer with no evidence of disease recurrence.  This is very favorable.  He is completing 5 years of tamoxifen.  In cases that are node positive or otherwise high risk I sometimes recommend continuing tamoxifen for a total of 10 years.  In Steve's case there would be no significant benefit and risk reduction and so we are stopping tamoxifen now.  I am comfortable releasing him to his primary care group[facility] [place].  He sees Angela Adam the nurse practitioner there.  All he needs in terms of breast cancer follow-up is yearly breast and chest wall exam.  He does not need yearly mammography from this point.  We discussed screening for his daughter's and I gave him information on the abbreviated MRI, which I would recommend in their case yearly alternating 6 months apart with yearly mammography.  I also gave him a copy of the assessment above which includes the genes that were tested in his genetics evaluation  I will be glad to see Riley Murray at any point in the future if and when the need arises but as of now are making no further return appointments for him here.  Total encounter time 25 minutes.*  Sherril Shipman, Virgie Dad, MD  03/22/20 5:35 PM Medical Oncology  and Hematology [facility] [place], [place] [address] Tel. [phone]  Fax. [phone]   I, Wilburn Mylar, am acting as scribe for Dr. Virgie Dad. Lillie Portner.  I, Lurline Del MD, have reviewed the above documentation for accuracy and completeness, and I agree with the above.   *Total Encounter Time as defined by the Centers for Medicare and Medicaid Services includes, in addition to the face-to-face time of a patient visit (documented in the note above) non-face-to-face time: obtaining and reviewing outside history, ordering and reviewing medications, tests or procedures, care coordination (communications with other health care professionals or caregivers) and documentation in the medical record.

## 2020-03-23 ENCOUNTER — Telehealth: Payer: Self-pay | Admitting: Medical

## 2020-03-23 ENCOUNTER — Telehealth: Payer: Self-pay | Admitting: Oncology

## 2020-03-23 NOTE — Telephone Encounter (Signed)
No 5/25 los. No changes made to pt's schedule.

## 2022-04-23 ENCOUNTER — Encounter: Payer: Self-pay | Admitting: Gastroenterology
